# Patient Record
Sex: Female | Born: 2005 | Race: White | Hispanic: No | Marital: Single | Smoking: Never smoker
Health system: Southern US, Community
[De-identification: ages and names within clinical notes are randomized; demographics above are authoritative.]

## PROBLEM LIST (undated history)

## (undated) DIAGNOSIS — F32A Depression, unspecified: Secondary | ICD-10-CM

## (undated) DIAGNOSIS — F429 Obsessive-compulsive disorder, unspecified: Secondary | ICD-10-CM

## (undated) DIAGNOSIS — J45909 Unspecified asthma, uncomplicated: Secondary | ICD-10-CM

## (undated) DIAGNOSIS — K5900 Constipation, unspecified: Secondary | ICD-10-CM

## (undated) DIAGNOSIS — T7840XA Allergy, unspecified, initial encounter: Secondary | ICD-10-CM

## (undated) DIAGNOSIS — F509 Eating disorder, unspecified: Secondary | ICD-10-CM

## (undated) DIAGNOSIS — K219 Gastro-esophageal reflux disease without esophagitis: Secondary | ICD-10-CM

## (undated) HISTORY — DX: Obsessive-compulsive disorder, unspecified: F42.9

## (undated) HISTORY — DX: Constipation, unspecified: K59.00

## (undated) HISTORY — DX: Gastro-esophageal reflux disease without esophagitis: K21.9

## (undated) HISTORY — DX: Unspecified asthma, uncomplicated: J45.909

## (undated) HISTORY — DX: Depression, unspecified: F32.A

## (undated) HISTORY — PX: MYRINGOTOMY: SUR874

## (undated) HISTORY — DX: Allergy, unspecified, initial encounter: T78.40XA

---
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
## 2023-01-02 ENCOUNTER — Encounter: Payer: Self-pay | Admitting: Student

## 2023-01-02 ENCOUNTER — Ambulatory Visit: Payer: BC Managed Care – PPO | Admitting: Student

## 2023-01-02 ENCOUNTER — Ambulatory Visit: Payer: BC Managed Care – PPO | Admitting: Plastic Surgery

## 2023-01-02 VITALS — BP 120/76 | HR 105 | Ht 64.5 in | Wt 156.8 lb

## 2023-01-02 DIAGNOSIS — M549 Dorsalgia, unspecified: Secondary | ICD-10-CM

## 2023-01-02 DIAGNOSIS — N62 Hypertrophy of breast: Secondary | ICD-10-CM

## 2023-01-02 NOTE — Progress Notes (Signed)
   Referring Provider Karie Georges, MD [address] [place],  [place] [address]   CC: No chief complaint on file.     Ann Vaughan is an 17 y.o. female.  HPI: Patient is a 17 y.o. year old female here for follow up after completing physical therapy for pain related to macromastia.   She was seen for initial consult by Dr. Ladona Ridgel on 09/07/2022.  At that time, patient complained of upper back and neck pain which she attributed to the large size of her breasts.  Patient reported her breasts were significantly out of proportion to her body size and would like to be much smaller.  It was noted that patient had large breasts which were out of proportion to the size of her body.  Physical therapy was ordered for the patient.  Today, patient presents with her mother at bedside.  Patient states that she is doing well, and she completed 6 weeks of physical therapy.  She states that although she completed physical therapy, she is still having some upper back pain.  She also states that she feels her breasts are not proportionate to the size of her body and also sometimes feels that is difficult to keep good posture due to her enlarged breasts.  She states that she would like to move forward with a breast reduction.  She denies any recent changes in her health.   Review of Systems General: Denies any recent changes in her health MSK: Endorses ongoing back discomfort Skin: Denies rashes  Physical Exam    09/07/2022   10:15 AM 09/04/2022    1:28 PM 05/21/2022    3:16 PM  Vitals with BMI  Height 5\' 4"  5\' 4"    Weight 140 lbs 10 oz 139 lbs 5 oz 129 lbs 3 oz  BMI 24.12 23.9   Systolic 112 100 034  Diastolic 71 60 67  Pulse 128 105 112    General:  No acute distress,  Alert and oriented, Non-Toxic, Normal speech and affect Psych: Normal behavior and mood Respiratory: No increased WOB MSK: Ambulatory  Assessment/Plan  Macromastia   Patient is interested in pursuing surgical  intervention for bilateral breast reduction. Patient has completed at least 6 weeks of physical therapy for pain related to macromastia.  Discussed with patient we would submit to insurance for authorization, discussed approval could take up to 6 weeks.   All of the patient's and the patient's mother's questions were answered to their satisfaction.  Instructed them to call if they have any more questions.  Laurena Spies 01/02/2023, 10:07 AM

## 2023-01-14 ENCOUNTER — Encounter: Payer: Self-pay | Admitting: Family Medicine

## 2023-01-14 ENCOUNTER — Ambulatory Visit (INDEPENDENT_AMBULATORY_CARE_PROVIDER_SITE_OTHER): Payer: BC Managed Care – PPO | Admitting: Family Medicine

## 2023-01-14 VITALS — BP 90/60 | HR 95 | Temp 98.6°F | Ht 65.0 in | Wt 160.3 lb

## 2023-01-14 DIAGNOSIS — Z23 Encounter for immunization: Secondary | ICD-10-CM | POA: Diagnosis not present

## 2023-01-14 DIAGNOSIS — J4530 Mild persistent asthma, uncomplicated: Secondary | ICD-10-CM | POA: Diagnosis not present

## 2023-01-14 DIAGNOSIS — Z0000 Encounter for general adult medical examination without abnormal findings: Secondary | ICD-10-CM | POA: Diagnosis not present

## 2023-01-14 MED ORDER — QVAR REDIHALER 40 MCG/ACT IN AERB: 2.0000 | INHALATION_SPRAY | Freq: Two times a day (BID) | RESPIRATORY_TRACT | 3 refills | Status: AC

## 2023-01-14 NOTE — Patient Instructions (Addendum)

## 2023-01-14 NOTE — Progress Notes (Signed)
Complete physical exam  Patient: Ann Vaughan   DOB: 01-May-2005   17 y.o. Female  MRN: [id number]  Subjective:    Chief Complaint  Patient presents with   Annual Exam    Ann Vaughan is a 17 y.o. female who presents today for a complete physical exam. She reports consuming a  restrictive eating disorder  diet. Gym/ health club routine includes cardio and light weights. She generally feels well. She reports sleeping pt has chronic insomnia. She does not have additional problems to discuss today.    Most recent fall risk assessment:    03/05/2018    2:21 PM  Fall Risk   Falls in the past year? 0     Most recent depression screenings:    01/14/2023    8:32 AM 09/04/2022    1:26 PM  PHQ 2/9 Scores  PHQ - 2 Score 6 5  PHQ- 9 Score 19 17    Vision:Not within last year  and Dental: No current dental problems and Receives regular dental care  Patient Active Problem List   Diagnosis Date Noted   Avoidant-restrictive food intake disorder (ARFID) 09/04/2022   Chronic bilateral low back pain 09/04/2022   Gynecomastia, female 09/04/2022   Mild intermittent asthma    Pelvic pain in female 04/24/2022   Endometriosis 04/24/2022   MDD (major depressive disorder), recurrent, severe, 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/23/2019   Self-injurious behavior 03/23/2019   Suicide ideation 03/23/2019      Patient Care Team: Karie Georges, MD as PCP - General (Family Medicine)   Outpatient Medications Prior to Visit  Medication Sig   acetaminophen (TYLENOL) 500 MG tablet Take 1 tablet (500 mg total) by mouth every 6 (six) hours as needed.   albuterol (PROVENTIL) (2.5 MG/3ML) 0.083% nebulizer solution Take 2.5 mg by nebulization every 6 (six) hours as needed. For shortness of breath   albuterol (VENTOLIN HFA) 108 (90 Base) MCG/ACT inhaler Inhale 1 puff into the lungs every 6 (six) hours as needed for wheezing or shortness of breath.    cholecalciferol (VITAMIN D3) 25 MCG (1000 UT) tablet Take  2,000 Units by mouth in the morning.   escitalopram (LEXAPRO) 20 MG tablet Take 20 mg by mouth daily.   ibuprofen (ADVIL) 800 MG tablet TAKE 1 TABLET (800 MG TOTAL) BY MOUTH 3 (THREE) TIMES DAILY WITH MEALS AS NEEDED FOR HEADACHE, MODERATE PAIN OR CRAMPING.   lamoTRIgine (LAMICTAL) 150 MG tablet Take 150 mg by mouth 2 (two) times daily.   lithium carbonate 150 MG capsule Take 150 mg by mouth at bedtime.   methylphenidate 18 MG PO CR tablet Take 18 mg by mouth every morning.   montelukast (SINGULAIR) 10 MG tablet Take 1 tablet (10 mg total) by mouth at bedtime.   norethindrone-ethinyl estradiol-iron (JUNEL FE 1.5/30) 1.5-30 MG-MCG tablet Take 1 tablet by mouth daily.   polyethylene glycol (MIRALAX / GLYCOLAX) 17 g packet Take 17 g by mouth daily as needed.   [DISCONTINUED] hydrOXYzine (ATARAX/VISTARIL) 10 MG tablet TAKE ONE TABLET BY MOUTH THREE TIMES A DAY AS NEEDED (Patient taking differently: Take 10 mg by mouth at bedtime.)   [DISCONTINUED] ondansetron (ZOFRAN-ODT) 4 MG disintegrating tablet Take 4 mg by mouth every 8 (eight) hours as needed for nausea or vomiting.   No facility-administered medications prior to visit.    Review of Systems  HENT:  Negative for hearing loss.   Eyes:  Negative for blurred vision.  Respiratory:  Negative for shortness of breath.  Cardiovascular:  Negative for chest pain.  Gastrointestinal: Negative.   Genitourinary: Negative.   Musculoskeletal:  Negative for back pain.  Neurological:  Negative for headaches.  Psychiatric/Behavioral:  Negative for depression.        Objective:     BP 90/60 (BP Location: Left Arm, Patient Position: Sitting, Cuff Size: Normal)   Pulse 95   Temp 98.6 F (37 C) (Oral)   Ht 5\' 5"  (1.651 m)   Wt 160 lb 4.8 oz (72.7 kg)   SpO2 98%   BMI 26.68 kg/m    Physical Exam Vitals reviewed.  Constitutional:      Appearance: Normal appearance. She is well-groomed and normal weight.  HENT:     Right Ear: Tympanic membrane  and ear canal normal.     Left Ear: Tympanic membrane and ear canal normal.     Mouth/Throat:     Mouth: Mucous membranes are moist.     Pharynx: No posterior oropharyngeal erythema.  Eyes:     Conjunctiva/sclera: Conjunctivae normal.  Neck:     Thyroid: No thyromegaly.  Cardiovascular:     Rate and Rhythm: Normal rate and regular rhythm.     Pulses: Normal pulses.     Heart sounds: S1 normal and S2 normal.  Pulmonary:     Effort: Pulmonary effort is normal.     Breath sounds: Normal breath sounds and air entry.  Abdominal:     General: Abdomen is flat. Bowel sounds are normal.     Palpations: Abdomen is soft.  Musculoskeletal:     Right lower leg: No edema.     Left lower leg: No edema.  Lymphadenopathy:     Cervical: No cervical adenopathy.  Neurological:     Mental Status: She is alert and oriented to person, place, and time. Mental status is at baseline.     Gait: Gait is intact.  Psychiatric:        Mood and Affect: Mood and affect normal.        Speech: Speech normal.        Behavior: Behavior normal.        Judgment: Judgment normal.      No results found for any visits on 01/14/23.     Assessment & Plan:    Routine Health Maintenance and Physical Exam  Immunization History  Administered Date(s) Administered   Influenza,inj,Quad PF,6+ Mos 12/09/2018   PFIZER(Purple Top)SARS-COV-2 Vaccination 07/01/2019, 07/17/2019, 02/17/2020, 12/09/2020   Tdap 09/03/2021    Health Maintenance  Topic Date Due   HPV VACCINES (1 - 3-dose series) Never done   COVID-19 Vaccine (5 - 2023-24 season) 10/14/2022   INFLUENZA VACCINE  05/13/2023 (Originally 09/13/2022)   Hepatitis C Screening  09/04/2023 (Originally 07/22/2021)   HIV Screening  09/04/2023 (Originally 07/23/2018)   DTaP/Tdap/Td (2 - Td or Tdap) 09/04/2031    Discussed health benefits of physical activity, and encouraged her to engage in regular exercise appropriate for her age and condition.  Lipid  screening  Mild persistent asthma without complication -     Qvar RediHaler; Inhale 2 puffs into the lungs 2 (two) times daily.  Dispense: 1 each; Refill: 3  Routine general medical examination at a health care facility  Normal physical exam findings. Counseled patient on the flu vaccination, healthy sleep habits and adding probiotics for her gut issues. Pt is interested in coming off the singulair, will add Qvar inhaler to control her asthma so she can try coming off the singulair.   Return  in 1 year (on 01/14/2024).     Karie Georges, MD

## 2023-02-02 ENCOUNTER — Other Ambulatory Visit: Payer: Self-pay | Admitting: Family Medicine

## 2023-02-04 MED ORDER — ALBUTEROL SULFATE HFA 108 (90 BASE) MCG/ACT IN AERS: 1.0000 | INHALATION_SPRAY | Freq: Four times a day (QID) | RESPIRATORY_TRACT | 1 refills | Status: DC | PRN

## 2023-02-15 ENCOUNTER — Emergency Department (HOSPITAL_COMMUNITY)
Admission: EM | Admit: 2023-02-15 | Discharge: 2023-02-15 | Disposition: A | Discharge status: Home / Self Care | Payer: BC Managed Care – PPO | Attending: Emergency Medicine | Admitting: Emergency Medicine

## 2023-02-15 ENCOUNTER — Other Ambulatory Visit: Payer: Self-pay | Admitting: Family Medicine

## 2023-02-15 DIAGNOSIS — Z20822 Contact with and (suspected) exposure to covid-19: Secondary | ICD-10-CM | POA: Diagnosis not present

## 2023-02-15 DIAGNOSIS — J4541 Moderate persistent asthma with (acute) exacerbation: Secondary | ICD-10-CM | POA: Diagnosis not present

## 2023-02-15 DIAGNOSIS — J4531 Mild persistent asthma with (acute) exacerbation: Secondary | ICD-10-CM | POA: Insufficient documentation

## 2023-02-15 DIAGNOSIS — R059 Cough, unspecified: Secondary | ICD-10-CM | POA: Diagnosis not present

## 2023-02-15 LAB — RESP PANEL BY RT-PCR (RSV, FLU A&B, COVID)  RVPGX2
Influenza A by PCR: NEGATIVE
Influenza B by PCR: NEGATIVE
Resp Syncytial Virus by PCR: NEGATIVE
SARS Coronavirus 2 by RT PCR: NEGATIVE

## 2023-02-15 MED ORDER — ALBUTEROL SULFATE (2.5 MG/3ML) 0.083% IN NEBU
5.0000 mg | INHALATION_SOLUTION | Freq: Once | RESPIRATORY_TRACT | Status: AC
  Administered 2023-02-15: 5 mg via RESPIRATORY_TRACT
  Filled 2023-02-15: qty 6

## 2023-02-15 MED ORDER — IPRATROPIUM BROMIDE 0.02 % IN SOLN
0.5000 mg | Freq: Once | RESPIRATORY_TRACT | Status: AC
  Administered 2023-02-15: 0.5 mg via RESPIRATORY_TRACT
  Filled 2023-02-15: qty 2.5

## 2023-02-15 MED ORDER — DEXAMETHASONE 4 MG PO TABS
10.0000 mg | ORAL_TABLET | Freq: Once | ORAL | Status: AC
  Administered 2023-02-15: 10 mg via ORAL
  Filled 2023-02-15: qty 1

## 2023-02-15 NOTE — ED Triage Notes (Signed)
 Patient states over the last 4-5 hours she's been having an asthma attack with no relief from her rescue inhaler.

## 2023-02-15 NOTE — ED Triage Notes (Signed)
 Called to assess patient for complaints of asthma attack. Patient speaking in full sentences. No dyspnea noted. VSS on room air. BBS clear in all fields. Strong, dry cough. Patient states she has recently transitioned off singulair  and using zyrtec with qvar  for maintenance. Received flu shot approximatly 3 weeks ago and has not gotten a covid booster.

## 2023-02-15 NOTE — ED Provider Notes (Signed)
 [place] EMERGENCY DEPARTMENT AT [facility] Provider Note   CSN: [id number] Arrival date & time: 02/15/23  0441     History  Chief Complaint  Patient presents with   Asthma    Ann Vaughan is a 18 y.o. female.  Patient is a 18 year old female with a history of asthma, endometriosis, OCD and allergies who is presenting today with complaint of tightness in her chest, coughing and shortness of breath.  She reports it has been present since last night and has been continuous.  She has recently changed her medications and is on Qvar  daily but has stopped her Singulair  due to possible psychiatric side effects.  She has not had fever, nasal congestion or URI type symptoms.  Has tried her albuterol  at home without improvement.  She has no hemoptysis.  No prior history of PEs or DVTs but she is on an OCP.  She does not use tobacco products.  She does not have regular menses due to her OCP.  The history is provided by the patient.  Asthma       Home Medications Prior to Admission medications   Medication Sig Start Date End Date Taking? Authorizing Provider  acetaminophen  (TYLENOL ) 500 MG tablet Take 1 tablet (500 mg total) by mouth every 6 (six) hours as needed. 04/24/22   Ajewole, Christana, MD  albuterol  (PROVENTIL ) (2.5 MG/3ML) 0.083% nebulizer solution Take 2.5 mg by nebulization every 6 (six) hours as needed. For shortness of breath    [provider]  albuterol  (VENTOLIN  HFA) 108 (90 Base) MCG/ACT inhaler Inhale 1 puff into the lungs every 6 (six) hours as needed for wheezing or shortness of breath. 02/04/23   Ozell Heron HERO, MD  beclomethasone (QVAR  REDIHALER) 40 MCG/ACT inhaler Inhale 2 puffs into the lungs 2 (two) times daily. 01/14/23   Ozell Heron HERO, MD  cholecalciferol (VITAMIN D3) 25 MCG (1000 UT) tablet Take 2,000 Units by mouth in the morning.    [provider]  escitalopram  (LEXAPRO ) 20 MG tablet Take 20 mg by mouth daily.    [provider]  ibuprofen  (ADVIL ) 800 MG tablet TAKE 1 TABLET (800 MG TOTAL) BY MOUTH 3 (THREE) TIMES DAILY WITH MEALS AS NEEDED FOR HEADACHE, MODERATE PAIN OR CRAMPING. 05/24/22   Ajewole, Christana, MD  lamoTRIgine (LAMICTAL) 150 MG tablet Take 150 mg by mouth 2 (two) times daily. 07/04/22   [provider]  lithium  carbonate 150 MG capsule Take 150 mg by mouth at bedtime. 12/26/22   [provider]  methylphenidate 18 MG PO CR tablet Take 18 mg by mouth every morning. 08/29/22   [provider]  montelukast  (SINGULAIR ) 10 MG tablet Take 1 tablet (10 mg total) by mouth at bedtime. 11/05/22   Ozell Heron HERO, MD  norethindrone -ethinyl estradiol-iron (JUNEL FE 1.5/30) 1.5-30 MG-MCG tablet Take 1 tablet by mouth daily. 07/26/22   Ajewole, Christana, MD  polyethylene glycol (MIRALAX  / GLYCOLAX ) 17 g packet Take 17 g by mouth daily as needed. 04/24/22   Ajewole, Christana, MD      Allergies    Other    Review of Systems   Review of Systems  Physical Exam Updated Vital Signs BP 108/82   Pulse (!) 111   Temp 98.5 F (36.9 C) (Oral)   Resp 16   Ht 5' 4 (1.626 m)   Wt 72.6 kg   SpO2 97%   BMI 27.46 kg/m  Physical Exam Vitals and nursing note reviewed.  Constitutional:  General: She is not in acute distress.    Appearance: She is well-developed.  HENT:     Head: Normocephalic and atraumatic.  Eyes:     Pupils: Pupils are equal, round, and reactive to light.  Cardiovascular:     Rate and Rhythm: Regular rhythm. Tachycardia present.     Pulses: Normal pulses.     Heart sounds: Normal heart sounds. No murmur heard.    No friction rub.  Pulmonary:     Effort: Pulmonary effort is normal.     Breath sounds: Normal breath sounds. No wheezing or rales.     Comments: Persistent coughing on exam.  Mild decreased breath sounds in the bases but no audible wheezing Musculoskeletal:        General: No tenderness. Normal range of motion.     Comments: No edema   Skin:    General: Skin is warm and dry.     Findings: No rash.  Neurological:     Mental Status: She is alert and oriented to person, place, and time.     Cranial Nerves: No cranial nerve deficit.  Psychiatric:        Behavior: Behavior normal.     ED Results / Procedures / Treatments   Labs (all labs ordered are listed, but only abnormal results are displayed) Labs Reviewed  RESP PANEL BY RT-PCR (RSV, FLU A&B, COVID)  [id number]    EKG None  Radiology No results found.  Procedures Procedures    Medications Ordered in ED Medications  albuterol  (PROVENTIL ) (2.5 MG/3ML) 0.083% nebulizer solution 5 mg (5 mg Nebulization Given 02/15/23 0755)  ipratropium (ATROVENT ) nebulizer solution 0.5 mg (0.5 mg Nebulization Given 02/15/23 0755)  dexamethasone  (DECADRON ) tablet 10 mg (10 mg Oral Given 02/15/23 0841)    ED Course/ Medical Decision Making/ A&P                                 Medical Decision Making Risk Prescription drug management.   Pt with typical asthma exacerbation  symptoms.  No infectious sx, productive cough or other complaints.  Low suspicion for PE.  Decreased breath sounds and coughing on exam with sats of 97% on RA.  Will give albuterol /atrovent  and recheck.  Pt improved after neb and feels much better.  Will give 1 time dose of decadron  and pt has all other meds she needs. .         Final Clinical Impression(s) / ED Diagnoses Final diagnoses:  Mild persistent asthma with exacerbation    Rx / DC Orders ED Discharge Orders     None         Doretha Folks, MD 02/15/23 1055

## 2023-02-15 NOTE — Discharge Instructions (Addendum)
 Continue to use your qvar and zyrtec.  Return if your symptoms worsen

## 2023-02-19 ENCOUNTER — Other Ambulatory Visit (HOSPITAL_BASED_OUTPATIENT_CLINIC_OR_DEPARTMENT_OTHER): Payer: Self-pay

## 2023-02-19 MED ORDER — ALBUTEROL SULFATE (2.5 MG/3ML) 0.083% IN NEBU
2.5000 mg | INHALATION_SOLUTION | Freq: Four times a day (QID) | RESPIRATORY_TRACT | 2 refills | Status: AC | PRN
  Filled 2023-02-19: qty 150, 13d supply, fill #0

## 2023-03-04 ENCOUNTER — Other Ambulatory Visit (HOSPITAL_BASED_OUTPATIENT_CLINIC_OR_DEPARTMENT_OTHER): Payer: Self-pay

## 2023-03-26 ENCOUNTER — Telehealth: Payer: Self-pay

## 2023-03-26 NOTE — Telephone Encounter (Signed)
Patient called stating she has not heard anything regarding her case or referral.  Spoke with patient and informed patient it is pending, just waiting to hear back from the insurance company.

## 2023-03-29 DIAGNOSIS — F900 Attention-deficit hyperactivity disorder, predominantly inattentive type: Secondary | ICD-10-CM | POA: Diagnosis not present

## 2023-03-29 DIAGNOSIS — F331 Major depressive disorder, recurrent, moderate: Secondary | ICD-10-CM | POA: Diagnosis not present

## 2023-03-29 DIAGNOSIS — F419 Anxiety disorder, unspecified: Secondary | ICD-10-CM | POA: Diagnosis not present

## 2023-04-12 DIAGNOSIS — F331 Major depressive disorder, recurrent, moderate: Secondary | ICD-10-CM | POA: Diagnosis not present

## 2023-04-12 DIAGNOSIS — Z79899 Other long term (current) drug therapy: Secondary | ICD-10-CM | POA: Diagnosis not present

## 2023-04-12 DIAGNOSIS — F419 Anxiety disorder, unspecified: Secondary | ICD-10-CM | POA: Diagnosis not present

## 2023-05-02 ENCOUNTER — Other Ambulatory Visit: Payer: Self-pay | Admitting: Family Medicine

## 2023-05-02 ENCOUNTER — Ambulatory Visit: Admitting: Family Medicine

## 2023-05-02 VITALS — BP 120/90 | HR 110 | Temp 98.3°F | Ht 64.01 in | Wt 158.0 lb

## 2023-05-02 DIAGNOSIS — R0981 Nasal congestion: Secondary | ICD-10-CM | POA: Diagnosis not present

## 2023-05-02 DIAGNOSIS — R062 Wheezing: Secondary | ICD-10-CM

## 2023-05-02 DIAGNOSIS — J4541 Moderate persistent asthma with (acute) exacerbation: Secondary | ICD-10-CM

## 2023-05-02 LAB — POCT INFLUENZA A/B
Influenza A, POC: NEGATIVE
Influenza B, POC: NEGATIVE

## 2023-05-02 LAB — POC COVID19 BINAXNOW: SARS Coronavirus 2 Ag: NEGATIVE

## 2023-05-02 MED ORDER — VENTOLIN HFA 108 (90 BASE) MCG/ACT IN AERS: 2.0000 | INHALATION_SPRAY | RESPIRATORY_TRACT | 1 refills | Status: AC | PRN

## 2023-05-02 MED ORDER — PREDNISONE 20 MG PO TABS: 40.0000 mg | ORAL_TABLET | Freq: Every day | ORAL | 0 refills | Status: AC

## 2023-05-02 NOTE — Telephone Encounter (Signed)
 I don't prescribe this for the patient, she will need to contact Dr. Kizzie Bane her [facility] provider for refills on this medication

## 2023-05-02 NOTE — Patient Instructions (Signed)
 Viral testing is negative.   I have sent in prednisone for you to take 2 tablets once daily in the morning with breakfast for the next 5 days.  I have sent in an albuterol inhaler for you to use 2 puffs every 4 hours as needed for wheezing.  Follow-up with me for new or worsening symptoms.  School note attached to [employer]

## 2023-05-02 NOTE — Progress Notes (Signed)
 Acute Office Visit  Subjective:     Patient ID: Ann Vaughan, female    DOB: 26-Sep-2005, 18 y.o.   MRN: [id number]  Chief Complaint  Patient presents with   Acute Visit    Headaches, nausea, dry cough, wheezing, SOB, itchy ears. Ongoing for about 3 days    HPI Patient is in today for evaluation of cough, SOB, wheezing, HA, nausea, itchy ears, for the last 3 days. Has hx asthma.  Requesting refill of albuterol inhaler, has been out.  Has been using Qvar, zyrtec, albuterol neb solution with mild relief.  Denies known sick contacts. Denies abdominal pain, vomiting, diarrhea, rash, fever, chills, other symptoms.  Medical hx as outlined below.  ROS Per HPI      Objective:    BP (!) 120/90 (BP Location: Left Arm, Patient Position: Sitting)   Pulse (!) 110   Temp 98.3 F (36.8 C) (Temporal)   Ht 5' 4.01" (1.626 m)   Wt 158 lb (71.7 kg)   SpO2 96%   BMI 27.11 kg/m    Physical Exam Vitals and nursing note reviewed.  Constitutional:      General: She is not in acute distress. HENT:     Head: Normocephalic and atraumatic.     Right Ear: Tympanic membrane and ear canal normal.     Left Ear: Tympanic membrane and ear canal normal.     Nose: Congestion present.     Mouth/Throat:     Mouth: Mucous membranes are moist.     Pharynx: Oropharynx is clear. No oropharyngeal exudate or posterior oropharyngeal erythema.     Comments: Oropharyngeal cobblestoning   Eyes:     Extraocular Movements: Extraocular movements intact.  Cardiovascular:     Rate and Rhythm: Regular rhythm. Tachycardia present.     Heart sounds: Normal heart sounds.  Pulmonary:     Effort: Pulmonary effort is normal. No respiratory distress.     Breath sounds: Wheezing present. No rhonchi or rales.  Musculoskeletal:     Cervical back: Normal range of motion and neck supple.  Lymphadenopathy:     Cervical: No cervical adenopathy.  Skin:    General: Skin is warm and dry.  Neurological:     Mental  Status: She is alert.    No results found for any visits on 05/02/23.      Assessment & Plan:   Wheezing -     POCT Influenza A/B -     POC COVID-19 BinaxNow  Nasal congestion -     POCT Influenza A/B -     POC COVID-19 BinaxNow  Moderate persistent asthma with acute exacerbation -     Ventolin HFA; Inhale 2 puffs into the lungs every 4 (four) hours as needed for wheezing or shortness of breath.  Dispense: 18 each; Refill: 1 -     predniSONE; Take 2 tablets (40 mg total) by mouth daily for 5 days.  Dispense: 10 tablet; Refill: 0  School note attached to MyChart  Meds ordered this encounter  Medications   albuterol (VENTOLIN HFA) 108 (90 Base) MCG/ACT inhaler    Sig: Inhale 2 puffs into the lungs every 4 (four) hours as needed for wheezing or shortness of breath.    Dispense:  18 each    Refill:  1   predniSONE (DELTASONE) 20 MG tablet    Sig: Take 2 tablets (40 mg total) by mouth daily for 5 days.    Dispense:  10 tablet    Refill:  0    Return if symptoms worsen or fail to improve.  Moshe Cipro, FNP

## 2023-05-15 NOTE — Progress Notes (Signed)
 Patient ID: Ann Vaughan, female    DOB: 04-Sep-2005, 18 y.o.   MRN: [id number]  No chief complaint on file.   No diagnosis found.   History of Present Illness: Ann Vaughan is a 18 y.o.  female  with a history of macromastia.  She presents for preoperative evaluation for upcoming procedure, Bilateral Breast Reduction, scheduled for 05/28/2023 with Dr.  Ladona Ridgel  The patient {HAS HAS [id number]} had problems with anesthesia. ***  Summary of Previous Visit: ***  Estimated excess breast tissue to be removed at time of surgery: *** grams  Job: ***  PMH Significant for: ***  Stop methylphenidate 2 days prior***   Past Medical History: Allergies: Allergies  Allergen Reactions   Other Shortness Of Breath    Environental/Asthma     Current Medications:  Current Outpatient Medications:    acetaminophen (TYLENOL) 500 MG tablet, Take 1 tablet (500 mg total) by mouth every 6 (six) hours as needed. (Patient not taking: Reported on 05/02/2023), Disp: 90 tablet, Rfl: 0   albuterol (PROVENTIL) (2.5 MG/3ML) 0.083% nebulizer solution, Inhale 3 mLs (1 vial) by nebulization every 6 (six) hours as needed. For shortness of breath, Disp: 150 mL, Rfl: 2   albuterol (VENTOLIN HFA) 108 (90 Base) MCG/ACT inhaler, Inhale 2 puffs into the lungs every 4 (four) hours as needed for wheezing or shortness of breath., Disp: 18 each, Rfl: 1   beclomethasone (QVAR REDIHALER) 40 MCG/ACT inhaler, Inhale 2 puffs into the lungs 2 (two) times daily., Disp: 1 each, Rfl: 3   cholecalciferol (VITAMIN D3) 25 MCG (1000 UT) tablet, Take 2,000 Units by mouth in the morning., Disp: , Rfl:    escitalopram (LEXAPRO) 20 MG tablet, Take 20 mg by mouth daily., Disp: , Rfl:    hydrOXYzine (ATARAX) 25 MG tablet, Take 25-50 mg by mouth at bedtime., Disp: , Rfl:    ibuprofen (ADVIL) 800 MG tablet, TAKE 1 TABLET (800 MG TOTAL) BY MOUTH 3 (THREE) TIMES DAILY WITH MEALS AS NEEDED FOR HEADACHE, MODERATE PAIN OR CRAMPING., Disp: 90  tablet, Rfl: 0   lamoTRIgine (LAMICTAL) 150 MG tablet, Take 150 mg by mouth 2 (two) times daily., Disp: , Rfl:    lithium carbonate 150 MG capsule, Take 150 mg by mouth at bedtime., Disp: , Rfl:    methylphenidate 18 MG PO CR tablet, Take 18 mg by mouth every morning., Disp: , Rfl:    montelukast (SINGULAIR) 10 MG tablet, Take 1 tablet (10 mg total) by mouth at bedtime. (Patient not taking: Reported on 05/02/2023), Disp: 30 tablet, Rfl: 3   norethindrone-ethinyl estradiol-iron (JUNEL FE 1.5/30) 1.5-30 MG-MCG tablet, Take 1 tablet by mouth daily., Disp: 90 tablet, Rfl: 4   polyethylene glycol (MIRALAX / GLYCOLAX) 17 g packet, Take 17 g by mouth daily as needed. (Patient not taking: Reported on 05/02/2023), Disp: 14 each, Rfl: 0  Past Medical Problems: Past Medical History:  Diagnosis Date   Allergies    Anxiety 2020   Asthma    Asthma    Constipation    12/20/2021   Depression    Eating disorder    Endometriosis 2019   GERD (gastroesophageal reflux disease)    OCD (obsessive compulsive disorder)     Past Surgical History: Past Surgical History:  Procedure Laterality Date   CYSTOSCOPY N/A 04/24/2022   Procedure: CYSTOSCOPY;  Surgeon: Lorriane Shire, MD;  Location: [facility] OR;  Service: Gynecology;  Laterality: N/A;   LAPAROSCOPY N/A 04/24/2022   Procedure: LAPAROSCOPY DIAGNOSTIC  AND POSSIBLE EXCISION OF ENDOMETRIOSIS;  Surgeon: Lorriane Shire, MD;  Location: [facility] OR;  Service: Gynecology;  Laterality: N/A;  Pull instruments GYN Cysto Set  Cysto Tubing and addtional [employer] Camera   MYRINGOTOMY      Social History: Social History   Socioeconomic History   Marital status: Single    Spouse name: Not on file   Number of children: 0   Years of education: Not on file   Highest education level: Some college, no degree  Occupational History   Occupation: retail associated  Tobacco Use   Smoking status: Never    Passive exposure: Yes   Smokeless tobacco: Never  Vaping Use    Vaping status: Never Used  Substance and Sexual Activity   Alcohol use: Not Currently   Drug use: Never   Sexual activity: Never    Birth control/protection: Abstinence, Pill  Other Topics Concern   Not on file  Social History Narrative   Not on file   Social Drivers of Health   Financial Resource Strain: Low Risk  (05/02/2023)   Overall Financial Resource Strain (CARDIA)    Difficulty of Paying Living Expenses: Not hard at all  Food Insecurity: No Food Insecurity (05/02/2023)   Hunger Vital Sign    Worried About Running Out of Food in the Last Year: Never true    Ran Out of Food in the Last Year: Never true  Transportation Needs: No Transportation Needs (05/02/2023)   PRAPARE - Administrator, Civil Service (Medical): No    Lack of Transportation (Non-Medical): No  Physical Activity: Insufficiently Active (05/02/2023)   Exercise Vital Sign    Days of Exercise per Week: 2 days    Minutes of Exercise per Session: 10 min  Stress: Stress Concern Present (05/02/2023)   [employer] of Occupational Health - Occupational Stress Questionnaire    Feeling of Stress : Very much  Social Connections: Socially Isolated (05/02/2023)   Social Connection and Isolation Panel [NHANES]    Frequency of Communication with Friends and Family: More than three times a week    Frequency of Social Gatherings with Friends and Family: Twice a week    Attends Religious Services: Never    Database administrator or Organizations: No    Attends Engineer, structural: Not on file    Marital Status: Never married  Intimate Partner Violence: Unknown (05/19/2021)   Received from [employer], [facility]   HITS    Physically Hurt: Not on file    Insult or Talk Down To: Not on file    Threaten Physical Harm: Not on file    Scream or Curse: Not on file    Family History: Family History  Problem Relation Age of Onset   Asthma Mother    Depression Mother    Irritable bowel syndrome  Mother    Scleroderma Mother    Anxiety disorder Mother    Alcohol abuse Father    Asthma Brother    Diabetes Maternal Grandmother    Arthritis Maternal Grandmother    Depression Maternal Grandmother    Arthritis Maternal Grandfather    Depression Maternal Grandfather    Cancer Paternal Grandmother    Uterine cancer Paternal Grandmother    Arthritis Paternal Grandfather    Esophageal cancer Neg Hx    Liver disease Neg Hx     Review of Systems: ROS  Physical Exam: Vital Signs There were no vitals taken for this visit.  Physical Exam ***  Constitutional:      General: Not in acute distress.    Appearance: Normal appearance. Not ill-appearing.  HENT:     Head: Normocephalic and atraumatic.  Eyes:     Pupils: Pupils are equal, round Neck:     Musculoskeletal: Normal range of motion.  Cardiovascular:     Rate and Rhythm: Normal rate    Pulses: Normal pulses.  Pulmonary:     Effort: Pulmonary effort is normal. No respiratory distress.  Musculoskeletal: Normal range of motion.  Skin:    General: Skin is warm and dry.     Findings: No erythema or rash.  Neurological:     General: No focal deficit present.     Mental Status: Alert and oriented to person, place, and time. Mental status is at baseline.     Motor: No weakness.  Psychiatric:        Mood and Affect: Mood normal.        Behavior: Behavior normal.    Assessment/Plan: The patient is scheduled for bilateral breast reduction with Dr. {[id number]::"VZDGLO","[id number]"}.  Risks, benefits, and alternatives of procedure discussed, questions answered and consent obtained.    Smoking Status: ***; Counseling Given? *** Last Mammogram: ***; Results: ***  Caprini Score: ***; Risk Factors include: ***, BMI *** 25, and length of planned surgery. Recommendation for mechanical *** pharmacological prophylaxis. Encourage early ambulation.   Pictures obtained: @consult ***  Post-op Rx sent to pharmacy:  {Blank:19197::"Oxycodone, Zofran, Keflex","Oxycodone, Zofran"}  Patient was provided with the breast reduction and General Surgical Risk consent document and Pain Medication Agreement prior to their appointment.  They had adequate time to read through the risk consent documents and Pain Medication Agreement. We also discussed them in person together during this preop appointment. All of their questions were answered to their satisfaction.  Recommended calling if they have any further questions.  Risk consent form and Pain Medication Agreement to be scanned into patient's chart.  The risk that can be encountered with breast reduction were discussed and include the following but not limited to these:  Breast asymmetry, fluid accumulation, firmness of the breast, inability to breast feed, loss of nipple or areola, skin loss, decrease or no nipple sensation, fat necrosis of the breast tissue, bleeding, infection, healing delay.  There are risks of anesthesia, changes to skin sensation and injury to nerves or blood vessels.  The muscle can be temporarily or permanently injured.  You may have an allergic reaction to tape, suture, glue, blood products which can result in skin discoloration, swelling, pain, skin lesions, poor healing.  Any of these can lead to the need for revisonal surgery or stage procedures.  A reduction has potential to interfere with diagnostic procedures.  Nipple or breast piercing can increase risks of infection.  This procedure is best done when the breast is fully developed.  Changes in the breast will continue to occur over time.  Pregnancy can alter the outcomes of previous breast reduction surgery, weight gain and weigh loss can also effect the long term appearance.     Electronically signed by: Kermit Balo Barak Bialecki, PA-C 05/15/2023 2:36 PM

## 2023-05-15 NOTE — H&P (View-Only) (Signed)
 Patient ID: Ann Vaughan, female    DOB: 04-Sep-2005, 18 y.o.   MRN: [id number]  No chief complaint on file.   No diagnosis found.   History of Present Illness: Ann Vaughan is a 18 y.o.  female  with a history of macromastia.  She presents for preoperative evaluation for upcoming procedure, Bilateral Breast Reduction, scheduled for 05/28/2023 with Dr.  Ladona Ridgel  The patient {HAS HAS [id number]} had problems with anesthesia. ***  Summary of Previous Visit: ***  Estimated excess breast tissue to be removed at time of surgery: *** grams  Job: ***  PMH Significant for: ***  Stop methylphenidate 2 days prior***   Past Medical History: Allergies: Allergies  Allergen Reactions   Other Shortness Of Breath    Environental/Asthma     Current Medications:  Current Outpatient Medications:    acetaminophen (TYLENOL) 500 MG tablet, Take 1 tablet (500 mg total) by mouth every 6 (six) hours as needed. (Patient not taking: Reported on 05/02/2023), Disp: 90 tablet, Rfl: 0   albuterol (PROVENTIL) (2.5 MG/3ML) 0.083% nebulizer solution, Inhale 3 mLs (1 vial) by nebulization every 6 (six) hours as needed. For shortness of breath, Disp: 150 mL, Rfl: 2   albuterol (VENTOLIN HFA) 108 (90 Base) MCG/ACT inhaler, Inhale 2 puffs into the lungs every 4 (four) hours as needed for wheezing or shortness of breath., Disp: 18 each, Rfl: 1   beclomethasone (QVAR REDIHALER) 40 MCG/ACT inhaler, Inhale 2 puffs into the lungs 2 (two) times daily., Disp: 1 each, Rfl: 3   cholecalciferol (VITAMIN D3) 25 MCG (1000 UT) tablet, Take 2,000 Units by mouth in the morning., Disp: , Rfl:    escitalopram (LEXAPRO) 20 MG tablet, Take 20 mg by mouth daily., Disp: , Rfl:    hydrOXYzine (ATARAX) 25 MG tablet, Take 25-50 mg by mouth at bedtime., Disp: , Rfl:    ibuprofen (ADVIL) 800 MG tablet, TAKE 1 TABLET (800 MG TOTAL) BY MOUTH 3 (THREE) TIMES DAILY WITH MEALS AS NEEDED FOR HEADACHE, MODERATE PAIN OR CRAMPING., Disp: 90  tablet, Rfl: 0   lamoTRIgine (LAMICTAL) 150 MG tablet, Take 150 mg by mouth 2 (two) times daily., Disp: , Rfl:    lithium carbonate 150 MG capsule, Take 150 mg by mouth at bedtime., Disp: , Rfl:    methylphenidate 18 MG PO CR tablet, Take 18 mg by mouth every morning., Disp: , Rfl:    montelukast (SINGULAIR) 10 MG tablet, Take 1 tablet (10 mg total) by mouth at bedtime. (Patient not taking: Reported on 05/02/2023), Disp: 30 tablet, Rfl: 3   norethindrone-ethinyl estradiol-iron (JUNEL FE 1.5/30) 1.5-30 MG-MCG tablet, Take 1 tablet by mouth daily., Disp: 90 tablet, Rfl: 4   polyethylene glycol (MIRALAX / GLYCOLAX) 17 g packet, Take 17 g by mouth daily as needed. (Patient not taking: Reported on 05/02/2023), Disp: 14 each, Rfl: 0  Past Medical Problems: Past Medical History:  Diagnosis Date   Allergies    Anxiety 2020   Asthma    Asthma    Constipation    12/20/2021   Depression    Eating disorder    Endometriosis 2019   GERD (gastroesophageal reflux disease)    OCD (obsessive compulsive disorder)     Past Surgical History: Past Surgical History:  Procedure Laterality Date   CYSTOSCOPY N/A 04/24/2022   Procedure: CYSTOSCOPY;  Surgeon: Lorriane Shire, MD;  Location: [facility] OR;  Service: Gynecology;  Laterality: N/A;   LAPAROSCOPY N/A 04/24/2022   Procedure: LAPAROSCOPY DIAGNOSTIC  AND POSSIBLE EXCISION OF ENDOMETRIOSIS;  Surgeon: Lorriane Shire, MD;  Location: [facility] OR;  Service: Gynecology;  Laterality: N/A;  Pull instruments GYN Cysto Set  Cysto Tubing and addtional [employer] Camera   MYRINGOTOMY      Social History: Social History   Socioeconomic History   Marital status: Single    Spouse name: Not on file   Number of children: 0   Years of education: Not on file   Highest education level: Some college, no degree  Occupational History   Occupation: retail associated  Tobacco Use   Smoking status: Never    Passive exposure: Yes   Smokeless tobacco: Never  Vaping Use    Vaping status: Never Used  Substance and Sexual Activity   Alcohol use: Not Currently   Drug use: Never   Sexual activity: Never    Birth control/protection: Abstinence, Pill  Other Topics Concern   Not on file  Social History Narrative   Not on file   Social Drivers of Health   Financial Resource Strain: Low Risk  (05/02/2023)   Overall Financial Resource Strain (CARDIA)    Difficulty of Paying Living Expenses: Not hard at all  Food Insecurity: No Food Insecurity (05/02/2023)   Hunger Vital Sign    Worried About Running Out of Food in the Last Year: Never true    Ran Out of Food in the Last Year: Never true  Transportation Needs: No Transportation Needs (05/02/2023)   PRAPARE - Administrator, Civil Service (Medical): No    Lack of Transportation (Non-Medical): No  Physical Activity: Insufficiently Active (05/02/2023)   Exercise Vital Sign    Days of Exercise per Week: 2 days    Minutes of Exercise per Session: 10 min  Stress: Stress Concern Present (05/02/2023)   [employer] of Occupational Health - Occupational Stress Questionnaire    Feeling of Stress : Very much  Social Connections: Socially Isolated (05/02/2023)   Social Connection and Isolation Panel [NHANES]    Frequency of Communication with Friends and Family: More than three times a week    Frequency of Social Gatherings with Friends and Family: Twice a week    Attends Religious Services: Never    Database administrator or Organizations: No    Attends Engineer, structural: Not on file    Marital Status: Never married  Intimate Partner Violence: Unknown (05/19/2021)   Received from [employer], [facility]   HITS    Physically Hurt: Not on file    Insult or Talk Down To: Not on file    Threaten Physical Harm: Not on file    Scream or Curse: Not on file    Family History: Family History  Problem Relation Age of Onset   Asthma Mother    Depression Mother    Irritable bowel syndrome  Mother    Scleroderma Mother    Anxiety disorder Mother    Alcohol abuse Father    Asthma Brother    Diabetes Maternal Grandmother    Arthritis Maternal Grandmother    Depression Maternal Grandmother    Arthritis Maternal Grandfather    Depression Maternal Grandfather    Cancer Paternal Grandmother    Uterine cancer Paternal Grandmother    Arthritis Paternal Grandfather    Esophageal cancer Neg Hx    Liver disease Neg Hx     Review of Systems: ROS  Physical Exam: Vital Signs There were no vitals taken for this visit.  Physical Exam ***  Constitutional:      General: Not in acute distress.    Appearance: Normal appearance. Not ill-appearing.  HENT:     Head: Normocephalic and atraumatic.  Eyes:     Pupils: Pupils are equal, round Neck:     Musculoskeletal: Normal range of motion.  Cardiovascular:     Rate and Rhythm: Normal rate    Pulses: Normal pulses.  Pulmonary:     Effort: Pulmonary effort is normal. No respiratory distress.  Musculoskeletal: Normal range of motion.  Skin:    General: Skin is warm and dry.     Findings: No erythema or rash.  Neurological:     General: No focal deficit present.     Mental Status: Alert and oriented to person, place, and time. Mental status is at baseline.     Motor: No weakness.  Psychiatric:        Mood and Affect: Mood normal.        Behavior: Behavior normal.    Assessment/Plan: The patient is scheduled for bilateral breast reduction with Dr. {[id number]::"VZDGLO","VFIEPPIRJJ"}.  Risks, benefits, and alternatives of procedure discussed, questions answered and consent obtained.    Smoking Status: ***; Counseling Given? *** Last Mammogram: ***; Results: ***  Caprini Score: ***; Risk Factors include: ***, BMI *** 25, and length of planned surgery. Recommendation for mechanical *** pharmacological prophylaxis. Encourage early ambulation.   Pictures obtained: @consult ***  Post-op Rx sent to pharmacy:  {Blank:19197::"Oxycodone, Zofran, Keflex","Oxycodone, Zofran"}  Patient was provided with the breast reduction and General Surgical Risk consent document and Pain Medication Agreement prior to their appointment.  They had adequate time to read through the risk consent documents and Pain Medication Agreement. We also discussed them in person together during this preop appointment. All of their questions were answered to their satisfaction.  Recommended calling if they have any further questions.  Risk consent form and Pain Medication Agreement to be scanned into patient's chart.  The risk that can be encountered with breast reduction were discussed and include the following but not limited to these:  Breast asymmetry, fluid accumulation, firmness of the breast, inability to breast feed, loss of nipple or areola, skin loss, decrease or no nipple sensation, fat necrosis of the breast tissue, bleeding, infection, healing delay.  There are risks of anesthesia, changes to skin sensation and injury to nerves or blood vessels.  The muscle can be temporarily or permanently injured.  You may have an allergic reaction to tape, suture, glue, blood products which can result in skin discoloration, swelling, pain, skin lesions, poor healing.  Any of these can lead to the need for revisonal surgery or stage procedures.  A reduction has potential to interfere with diagnostic procedures.  Nipple or breast piercing can increase risks of infection.  This procedure is best done when the breast is fully developed.  Changes in the breast will continue to occur over time.  Pregnancy can alter the outcomes of previous breast reduction surgery, weight gain and weigh loss can also effect the long term appearance.     Electronically signed by: Kermit Balo Barak Bialecki, PA-C 05/15/2023 2:36 PM

## 2023-05-16 ENCOUNTER — Encounter: Payer: Self-pay | Admitting: Surgical

## 2023-05-16 ENCOUNTER — Ambulatory Visit: Payer: BC Managed Care – PPO | Admitting: Surgical

## 2023-05-16 VITALS — BP 119/73 | HR 120 | Wt 159.0 lb

## 2023-05-16 DIAGNOSIS — N62 Hypertrophy of breast: Secondary | ICD-10-CM

## 2023-05-16 MED ORDER — ONDANSETRON HCL 4 MG PO TABS: 4.0000 mg | ORAL_TABLET | Freq: Three times a day (TID) | ORAL | 0 refills | Status: AC | PRN

## 2023-05-16 MED ORDER — OXYCODONE HCL 5 MG PO TABS: 5.0000 mg | ORAL_TABLET | Freq: Four times a day (QID) | ORAL | 0 refills | Status: AC | PRN

## 2023-05-21 ENCOUNTER — Encounter (HOSPITAL_BASED_OUTPATIENT_CLINIC_OR_DEPARTMENT_OTHER): Payer: Self-pay | Admitting: Plastic Surgery

## 2023-05-21 ENCOUNTER — Other Ambulatory Visit: Payer: Self-pay

## 2023-05-21 NOTE — Progress Notes (Signed)
   05/21/23 1117  PAT Phone Screen  Is the patient taking a GLP-1 receptor agonist? No  Do You Have Diabetes? No  Do You Have Hypertension? No  Have You Ever Been to the ER for Asthma? No  Have You Taken Oral Steroids in the Past 3 Months? (S)  Yes (05/02/23 prednisone burst pack for asthma exacerbation)  Do you Take Phenteramine or any Other Diet Drugs? No  Recent  Lab Work, EKG, CXR? No  Do you have a history of heart problems? No  Any Recent Hospitalizations? No  Height 5\' 4"  (1.626 m)  Weight 72 kg  Pat Appointment Scheduled No  Reason for No Appointment Not Needed   Reviewed pt's pmh of asthma and OV w/ PCP on 05/02/23 with Dr Chaney Malling. Ok to proceed w/ surgery on 05/28/23 as planned. Pt now using Qvar daily and states that she has had no issues with her breathing since completing the prednisone. Will use Qvar dos and bring albuterol inhaler with her.

## 2023-05-27 NOTE — Anesthesia Preprocedure Evaluation (Signed)
 Anesthesia Evaluation  Patient identified by MRN, date of birth, ID band Patient awake    Reviewed: Allergy & Precautions, NPO status , Patient's Chart, lab work & pertinent test results  History of Anesthesia Complications Negative for: history of anesthetic complications  Airway Mallampati: III  TM Distance: >3 FB Neck ROM: Full    Dental  (+) Dental Advisory Given   Pulmonary neg shortness of breath, asthma (completed steroid burst 04/2023, used Qvar this morning) , neg sleep apnea, neg COPD, neg recent URI   Pulmonary exam normal breath sounds clear to auscultation       Cardiovascular negative cardio ROS  Rhythm:Regular Rate:Normal     Neuro/Psych  PSYCHIATRIC DISORDERS (OCD) Anxiety Depression    negative neurological ROS     GI/Hepatic Neg liver ROS,GERD  ,,  Endo/Other  negative endocrine ROS    Renal/GU negative Renal ROS     Musculoskeletal   Abdominal   Peds  Hematology negative hematology ROS (+)   Anesthesia Other Findings   Reproductive/Obstetrics Endometriosis                              Anesthesia Physical Anesthesia Plan  ASA: 2  Anesthesia Plan: General   Post-op Pain Management: Tylenol PO (pre-op)*   Induction: Intravenous  PONV Risk Score and Plan: Ondansetron, Dexamethasone, Midazolam, Scopolamine patch - Pre-op and Treatment may vary due to age or medical condition  Airway Management Planned: Oral ETT  Additional Equipment:   Intra-op Plan:   Post-operative Plan: Extubation in OR  Informed Consent: I have reviewed the patients History and Physical, chart, labs and discussed the procedure including the risks, benefits and alternatives for the proposed anesthesia with the patient or authorized representative who has indicated his/her understanding and acceptance.     Dental advisory given  Plan Discussed with: CRNA and Anesthesiologist  Anesthesia  Plan Comments: (Risks of general anesthesia discussed including, but not limited to, sore throat, hoarse voice, chipped/damaged teeth, injury to vocal cords, nausea and vomiting, allergic reactions, lung infection, heart attack, stroke, and death. All questions answered. )        Anesthesia Quick Evaluation

## 2023-05-28 ENCOUNTER — Ambulatory Visit (HOSPITAL_BASED_OUTPATIENT_CLINIC_OR_DEPARTMENT_OTHER)
Admission: RE | Admit: 2023-05-28 | Discharge: 2023-05-28 | Disposition: A | Discharge status: Home / Self Care | Payer: BC Managed Care – PPO | Attending: Plastic Surgery | Admitting: Plastic Surgery

## 2023-05-28 ENCOUNTER — Ambulatory Visit (HOSPITAL_BASED_OUTPATIENT_CLINIC_OR_DEPARTMENT_OTHER): Payer: Self-pay | Admitting: Anesthesiology

## 2023-05-28 ENCOUNTER — Encounter (HOSPITAL_BASED_OUTPATIENT_CLINIC_OR_DEPARTMENT_OTHER)
Admission: RE | Disposition: A | Discharge status: Home / Self Care | Payer: Self-pay | Source: Home / Self Care | Attending: Plastic Surgery

## 2023-05-28 ENCOUNTER — Other Ambulatory Visit: Payer: Self-pay

## 2023-05-28 ENCOUNTER — Encounter (HOSPITAL_BASED_OUTPATIENT_CLINIC_OR_DEPARTMENT_OTHER): Payer: Self-pay | Admitting: Plastic Surgery

## 2023-05-28 DIAGNOSIS — N62 Hypertrophy of breast: Secondary | ICD-10-CM | POA: Insufficient documentation

## 2023-05-28 DIAGNOSIS — J45909 Unspecified asthma, uncomplicated: Secondary | ICD-10-CM | POA: Diagnosis not present

## 2023-05-28 DIAGNOSIS — K219 Gastro-esophageal reflux disease without esophagitis: Secondary | ICD-10-CM | POA: Insufficient documentation

## 2023-05-28 DIAGNOSIS — Z7951 Long term (current) use of inhaled steroids: Secondary | ICD-10-CM | POA: Diagnosis not present

## 2023-05-28 DIAGNOSIS — Z01818 Encounter for other preprocedural examination: Secondary | ICD-10-CM

## 2023-05-28 HISTORY — PX: BREAST REDUCTION SURGERY: SHX8

## 2023-05-28 LAB — POCT PREGNANCY, URINE: Preg Test, Ur: NEGATIVE

## 2023-05-28 SURGERY — MAMMOPLASTY, REDUCTION
Anesthesia: General | Site: Breast | Laterality: Bilateral

## 2023-05-28 MED ORDER — PROPOFOL 500 MG/50ML IV EMUL
INTRAVENOUS | Status: DC | PRN
  Administered 2023-05-28: 50 ug/kg/min via INTRAVENOUS

## 2023-05-28 MED ORDER — FENTANYL CITRATE (PF) 100 MCG/2ML IJ SOLN
INTRAMUSCULAR | Status: AC
  Filled 2023-05-28: qty 2

## 2023-05-28 MED ORDER — SODIUM CHLORIDE (PF) 0.9 % IJ SOLN
INTRAMUSCULAR | Status: AC
  Filled 2023-05-28: qty 100

## 2023-05-28 MED ORDER — LACTATED RINGERS IV SOLN: INTRAVENOUS | Status: DC

## 2023-05-28 MED ORDER — BUPIVACAINE LIPOSOME 1.3 % IJ SUSP
INTRAMUSCULAR | Status: AC
  Filled 2023-05-28: qty 20

## 2023-05-28 MED ORDER — SCOPOLAMINE 1 MG/3DAYS TD PT72
1.0000 | MEDICATED_PATCH | TRANSDERMAL | Status: DC
  Administered 2023-05-28: 1.5 mg via TRANSDERMAL

## 2023-05-28 MED ORDER — BUPIVACAINE HCL (PF) 0.25 % IJ SOLN
INTRAMUSCULAR | Status: AC
  Filled 2023-05-28: qty 60

## 2023-05-28 MED ORDER — ALBUMIN HUMAN 5 % IV SOLN: INTRAVENOUS | Status: DC | PRN

## 2023-05-28 MED ORDER — PHENYLEPHRINE HCL (PRESSORS) 10 MG/ML IV SOLN
INTRAVENOUS | Status: DC | PRN
  Administered 2023-05-28 (×3): 80 ug via INTRAVENOUS
  Administered 2023-05-28: 160 ug via INTRAVENOUS

## 2023-05-28 MED ORDER — OXYCODONE HCL 5 MG/5ML PO SOLN: 5.0000 mg | Freq: Once | ORAL | Status: AC | PRN

## 2023-05-28 MED ORDER — AMISULPRIDE (ANTIEMETIC) 5 MG/2ML IV SOLN
INTRAVENOUS | Status: AC
  Filled 2023-05-28: qty 4

## 2023-05-28 MED ORDER — MIDAZOLAM HCL 2 MG/2ML IJ SOLN
INTRAMUSCULAR | Status: AC
  Filled 2023-05-28: qty 2

## 2023-05-28 MED ORDER — ACETAMINOPHEN 500 MG PO TABS
1000.0000 mg | ORAL_TABLET | Freq: Once | ORAL | Status: AC
  Administered 2023-05-28: 1000 mg via ORAL

## 2023-05-28 MED ORDER — 0.9 % SODIUM CHLORIDE (POUR BTL) OPTIME
TOPICAL | Status: DC | PRN
  Administered 2023-05-28 (×3): 1000 mL

## 2023-05-28 MED ORDER — DEXAMETHASONE SODIUM PHOSPHATE 4 MG/ML IJ SOLN
INTRAMUSCULAR | Status: DC | PRN
  Administered 2023-05-28: 10 mg via INTRAVENOUS

## 2023-05-28 MED ORDER — ROCURONIUM BROMIDE 100 MG/10ML IV SOLN
INTRAVENOUS | Status: DC | PRN
  Administered 2023-05-28: 20 mg via INTRAVENOUS
  Administered 2023-05-28: 10 mg via INTRAVENOUS
  Administered 2023-05-28: 50 mg via INTRAVENOUS

## 2023-05-28 MED ORDER — LIDOCAINE HCL (CARDIAC) PF 100 MG/5ML IV SOSY
PREFILLED_SYRINGE | INTRAVENOUS | Status: DC | PRN
  Administered 2023-05-28: 80 mg via INTRAVENOUS

## 2023-05-28 MED ORDER — ONDANSETRON HCL 4 MG/2ML IJ SOLN
INTRAMUSCULAR | Status: DC | PRN
  Administered 2023-05-28: 4 mg via INTRAVENOUS

## 2023-05-28 MED ORDER — AMISULPRIDE (ANTIEMETIC) 5 MG/2ML IV SOLN: 10.0000 mg | Freq: Once | INTRAVENOUS | Status: DC | PRN

## 2023-05-28 MED ORDER — HYDROMORPHONE HCL 1 MG/ML IJ SOLN
INTRAMUSCULAR | Status: AC
  Filled 2023-05-28: qty 0.5

## 2023-05-28 MED ORDER — OXYCODONE HCL 5 MG PO TABS
ORAL_TABLET | ORAL | Status: AC
  Filled 2023-05-28: qty 1

## 2023-05-28 MED ORDER — CHLORHEXIDINE GLUCONATE CLOTH 2 % EX PADS: 6.0000 | MEDICATED_PAD | Freq: Once | CUTANEOUS | Status: DC

## 2023-05-28 MED ORDER — SCOPOLAMINE 1 MG/3DAYS TD PT72
MEDICATED_PATCH | TRANSDERMAL | Status: AC
  Filled 2023-05-28: qty 1

## 2023-05-28 MED ORDER — ACETAMINOPHEN 500 MG PO TABS
ORAL_TABLET | ORAL | Status: AC
  Filled 2023-05-28: qty 2

## 2023-05-28 MED ORDER — CEFAZOLIN SODIUM-DEXTROSE 2-4 GM/100ML-% IV SOLN
INTRAVENOUS | Status: AC
  Filled 2023-05-28: qty 100

## 2023-05-28 MED ORDER — CEFAZOLIN SODIUM-DEXTROSE 2-4 GM/100ML-% IV SOLN
2.0000 g | INTRAVENOUS | Status: AC
  Administered 2023-05-28: 2 g via INTRAVENOUS

## 2023-05-28 MED ORDER — PROPOFOL 10 MG/ML IV BOLUS
INTRAVENOUS | Status: AC
  Filled 2023-05-28: qty 20

## 2023-05-28 MED ORDER — DEXMEDETOMIDINE HCL IN NACL 80 MCG/20ML IV SOLN
INTRAVENOUS | Status: DC | PRN
Start: 2023-05-28 — End: 2023-05-28
  Administered 2023-05-28: 8 ug via INTRAVENOUS
  Administered 2023-05-28: 4 ug via INTRAVENOUS

## 2023-05-28 MED ORDER — OXYCODONE HCL 5 MG PO TABS
5.0000 mg | ORAL_TABLET | Freq: Once | ORAL | Status: AC | PRN
  Administered 2023-05-28: 5 mg via ORAL

## 2023-05-28 MED ORDER — HYDROMORPHONE HCL 1 MG/ML IJ SOLN
INTRAMUSCULAR | Status: DC | PRN
  Administered 2023-05-28 (×2): .5 mg via INTRAVENOUS

## 2023-05-28 MED ORDER — SUGAMMADEX SODIUM 200 MG/2ML IV SOLN
INTRAVENOUS | Status: DC | PRN
  Administered 2023-05-28: 200 mg via INTRAVENOUS

## 2023-05-28 MED ORDER — FENTANYL CITRATE (PF) 100 MCG/2ML IJ SOLN
25.0000 ug | INTRAMUSCULAR | Status: DC | PRN
  Administered 2023-05-28 (×3): 50 ug via INTRAVENOUS

## 2023-05-28 MED ORDER — MIDAZOLAM HCL 2 MG/2ML IJ SOLN
INTRAMUSCULAR | Status: DC | PRN
  Administered 2023-05-28: 2 mg via INTRAVENOUS

## 2023-05-28 MED ORDER — FENTANYL CITRATE (PF) 100 MCG/2ML IJ SOLN
INTRAMUSCULAR | Status: DC | PRN
Start: 2023-05-28 — End: 2023-05-28
  Administered 2023-05-28 (×2): 50 ug via INTRAVENOUS

## 2023-05-28 MED ORDER — SODIUM CHLORIDE (PF) 0.9 % IJ SOLN
INTRAMUSCULAR | Status: DC | PRN
  Administered 2023-05-28: 100 mL

## 2023-05-28 MED ORDER — HYDROMORPHONE HCL 1 MG/ML IJ SOLN
INTRAMUSCULAR | Status: AC
Start: 2023-05-28 — End: ?
  Filled 2023-05-28: qty 0.5

## 2023-05-28 MED ORDER — PROPOFOL 10 MG/ML IV BOLUS
INTRAVENOUS | Status: DC | PRN
Start: 2023-05-28 — End: 2023-05-28
  Administered 2023-05-28: 150 mg via INTRAVENOUS

## 2023-05-28 SURGICAL SUPPLY — 59 items
BINDER BREAST 3XL (GAUZE/BANDAGES/DRESSINGS) IMPLANT
BINDER BREAST LRG (GAUZE/BANDAGES/DRESSINGS) IMPLANT
BINDER BREAST MEDIUM (GAUZE/BANDAGES/DRESSINGS) IMPLANT
BINDER BREAST XLRG (GAUZE/BANDAGES/DRESSINGS) IMPLANT
BINDER BREAST XXLRG (GAUZE/BANDAGES/DRESSINGS) IMPLANT
BIOPATCH RED 1 DISK 7.0 (GAUZE/BANDAGES/DRESSINGS) ×2 IMPLANT
BLADE HEX COATED 2.75 (ELECTRODE) IMPLANT
BLADE SURG 10 STRL SS (BLADE) ×6 IMPLANT
BLADE SURG 15 STRL LF DISP TIS (BLADE) ×1 IMPLANT
CANISTER SUCT 1200ML W/VALVE (MISCELLANEOUS) ×1 IMPLANT
DERMABOND ADVANCED .7 DNX12 (GAUZE/BANDAGES/DRESSINGS) ×2 IMPLANT
DRAIN CHANNEL 19F RND (DRAIN) ×2 IMPLANT
DRAPE IMP U-DRAPE 54X76 (DRAPES) IMPLANT
DRAPE UTILITY XL STRL (DRAPES) ×1 IMPLANT
DRSG TEGADERM 4X4.75 (GAUZE/BANDAGES/DRESSINGS) ×2 IMPLANT
ELECT BLADE 4.0 EZ CLEAN MEGAD (MISCELLANEOUS) ×1 IMPLANT
ELECT REM PT RETURN 9FT ADLT (ELECTROSURGICAL) ×2 IMPLANT
ELECTRODE BLDE 4.0 EZ CLN MEGD (MISCELLANEOUS) ×1 IMPLANT
ELECTRODE REM PT RTRN 9FT ADLT (ELECTROSURGICAL) ×2 IMPLANT
EVACUATOR SILICONE 100CC (DRAIN) ×2 IMPLANT
GAUZE PAD ABD 8X10 STRL (GAUZE/BANDAGES/DRESSINGS) ×2 IMPLANT
GAUZE SPONGE 2X2 STRL 8-PLY (GAUZE/BANDAGES/DRESSINGS) ×2 IMPLANT
GLOVE BIO SURGEON STRL SZ 6.5 (GLOVE) IMPLANT
GLOVE BIO SURGEON STRL SZ7.5 (GLOVE) IMPLANT
GLOVE BIO SURGEON STRL SZ8 (GLOVE) ×1 IMPLANT
GLOVE BIOGEL PI IND STRL 7.0 (GLOVE) IMPLANT
GLOVE BIOGEL PI IND STRL 8 (GLOVE) ×1 IMPLANT
GOWN STRL REUS W/ TWL LRG LVL3 (GOWN DISPOSABLE) ×1 IMPLANT
GOWN STRL REUS W/TWL XL LVL3 (GOWN DISPOSABLE) ×1 IMPLANT
HEMOSTAT ARISTA ABSORB 3G PWDR (HEMOSTASIS) IMPLANT
HIBICLENS CHG 4% 4OZ BTL (MISCELLANEOUS) ×1 IMPLANT
MANIFOLD NEPTUNE II (INSTRUMENTS) IMPLANT
MARKER SKIN DUAL TIP RULER LAB (MISCELLANEOUS) ×1 IMPLANT
NDL HYPO 22X1.5 SAFETY MO (MISCELLANEOUS) ×2 IMPLANT
NEEDLE HYPO 22X1.5 SAFETY MO (MISCELLANEOUS) ×2 IMPLANT
NS IRRIG 1000ML POUR BTL (IV SOLUTION) ×1 IMPLANT
PACK BASIN DAY SURGERY FS (CUSTOM PROCEDURE TRAY) ×1 IMPLANT
PACK UNIVERSAL I (CUSTOM PROCEDURE TRAY) ×1 IMPLANT
PENCIL SMOKE EVACUATOR (MISCELLANEOUS) ×2 IMPLANT
PIN SAFETY STERILE (MISCELLANEOUS) ×1 IMPLANT
SLEEVE SCD COMPRESS KNEE MED (STOCKING) ×1 IMPLANT
SPONGE T-LAP 18X18 ~~LOC~~+RFID (SPONGE) ×3 IMPLANT
STAPLER SKIN PROX WIDE 3.9 (STAPLE) ×1 IMPLANT
STRIP CLOSURE SKIN 1/2X4 (GAUZE/BANDAGES/DRESSINGS) IMPLANT
SUT MNCRL AB 3-0 PS2 27 (SUTURE) ×4 IMPLANT
SUT MNCRL AB 4-0 PS2 18 (SUTURE) ×4 IMPLANT
SUT MON AB 2-0 CT1 36 (SUTURE) ×1 IMPLANT
SUT MON AB 5-0 PS2 18 (SUTURE) IMPLANT
SUT SILK 2 0 SH (SUTURE) ×2 IMPLANT
SUT VIC AB 3-0 SH 27X BRD (SUTURE) IMPLANT
SYR 20ML LL LF (SYRINGE) ×2 IMPLANT
SYR BULB IRRIG 60ML STRL (SYRINGE) ×1 IMPLANT
SYR CONTROL 10ML LL (SYRINGE) ×1 IMPLANT
TAPE MEASURE VINYL STERILE (MISCELLANEOUS) IMPLANT
TOWEL GREEN STERILE FF (TOWEL DISPOSABLE) ×2 IMPLANT
TRAY DSU PREP LF (CUSTOM PROCEDURE TRAY) ×1 IMPLANT
TUBE CONNECTING 20X1/4 (TUBING) ×1 IMPLANT
UNDERPAD 30X36 HEAVY ABSORB (UNDERPADS AND DIAPERS) ×2 IMPLANT
YANKAUER SUCT BULB TIP NO VENT (SUCTIONS) ×1 IMPLANT

## 2023-05-28 NOTE — Discharge Instructions (Addendum)
 INSTRUCTIONS FOR AFTER BREAST SURGERY   You will likely have some questions about what to expect following your operation.  The following information will help you and your family understand what to expect when you are discharged from the hospital.  It is important to follow these guidelines to help ensure a smooth recovery and reduce complication.  Postoperative instructions include information on: diet, wound care, medications and physical activity.  AFTER SURGERY Expect to go home after the procedure.  In some cases, you may need to spend one night in the hospital for observation.  DIET Breast surgery does not require a specific diet.  However, the healthier you eat the better your body will heal. It is important to increasing your protein intake.  This means limiting the foods with sugar and carbohydrates.  Focus on vegetables and some meat.  If you have liposuction during your procedure be sure to drink water.  If your urine is bright yellow, then it is concentrated, and you need to drink more water.  As a general rule after surgery, you should have 8 ounces of water every hour while awake.  If you find you are persistently nauseated or unable to take in liquids let us know.  NO TOBACCO USE or EXPOSURE.  This will slow your healing process and lead to a wound.  WOUND CARE Leave the binder on at all times except when showering . Use fragrance free soap like Dial, Dove or [place].   After 24 hours you can remove the binder to shower. Once dry apply binder or sports bra. No baths, pools or hot tubs for four weeks. We close your incision to leave the smallest and best-looking scar. No ointment or creams on your incisions for four weeks.  No Neosporin (Too many skin reactions).  A few weeks after surgery you can use Mederma and start massaging the scar. We ask you to wear your binder or sports bra for the first 6 weeks around the clock, including while sleeping. This provides added comfort and helps  reduce the fluid accumulation at the surgery site. NO Ice or heating pads to the operative site.  You have a very high risk of a BURN before you feel the temperature change. Continue to empty, recharge, & record drainage from drains 2-3 times a day, as needed.   ACTIVITY No heavy lifting until cleared by the doctor.  This usually means no more than a half-gallon of milk.  It is OK to walk and climb stairs. Moving your legs is very important to decrease your risk of a blood clot.  It will also help keep you from getting deconditioned.  Every 1 to 2 hours get up and walk for 5 minutes. This will help with a quicker recovery back to normal.  Let pain be your guide so you don't do too much.  This time is for you to recover.  You will be more comfortable if you sleep and rest with your head elevated either with a few pillows under you or in a recliner.  No stomach sleeping for a three months.  WORK Everyone returns to work at different times. As a rough guide, most people take at least 1 - 2 weeks off prior to returning to work. If you need documentation for your job, give the forms to the front staff at the clinic.  DRIVING Arrange for someone to bring you home from the hospital after your surgery.  You may be able to drive a few days  after surgery but not while taking any narcotics or valium.  BOWEL MOVEMENTS Constipation can occur after anesthesia and while taking pain medication.  It is important to stay ahead for your comfort.  We recommend taking Milk of Magnesia (2 tablespoons; twice a day) while taking the pain pills.  MEDICATIONS You may be prescribed should start after surgery At your preoperative visit for you history and physical you may have been given the following medications: Zofran 4 mg:  This is to treat nausea and vomiting.  You can take this every 6 hours as needed and only if needed. Oxycodone 5 mg:  This is only to be used after you have taken the Motrin or the Tylenol. Every 8  hours as needed.   Over the counter Medication to take: Ibuprofen (Motrin) 600 mg:  Take this every 6 hours.  If you have additional pain then take 500 mg of the Tylenol every 8 hours.  Only take the Norco after you have tried these two. MiraLAX or Milk of Magnesia: Take this according to the bottle if you take the Norco.  WHEN TO CALL Call your surgeon's office if any of the following occur: Fever 101 degrees F or greater Excessive bleeding or fluid from the incision site. Pain that increases over time without aid from the medications Redness, warmth, or pus draining from incision sites Persistent nausea or inability to take in liquids Severe misshapen area that underwent the operation.  Here are some resources for breast cancer patients:  Plastic surgery website: https://www.plasticsurgery.org/for-medical-professionals/education-and-resources/publications/breast-reconstruction-magazine Breast Reconstruction Awareness Campaign:  ChessContest.fr Plastic surgery Implant information:  https://www.plasticsurgery.org/patient-safety/breast-implant-safety     Post Anesthesia Home Care Instructions  Activity: Get plenty of rest for the remainder of the day. A responsible individual must stay with you for 24 hours following the procedure.  For the next 24 hours, DO NOT: -Drive a car -Advertising copywriter -Drink alcoholic beverages -Take any medication unless instructed by your physician -Make any legal decisions or sign important papers.  Meals: Start with liquid foods such as gelatin or soup. Progress to regular foods as tolerated. Avoid greasy, spicy, heavy foods. If nausea and/or vomiting occur, drink only clear liquids until the nausea and/or vomiting subsides. Call your physician if vomiting continues.  Special Instructions/Symptoms: Your throat may feel dry or sore from the anesthesia or the breathing tube placed in your throat during surgery. If this causes  discomfort, gargle with warm salt water. The discomfort should disappear within 24 hours.  If you had a scopolamine patch placed behind your ear for the management of post- operative nausea and/or vomiting:  1. The medication in the patch is effective for 72 hours, after which it should be removed.  Wrap patch in a tissue and discard in the trash. Wash hands thoroughly with soap and water. 2. You may remove the patch earlier than 72 hours if you experience unpleasant side effects which may include dry mouth, dizziness or visual disturbances. 3. Avoid touching the patch. Wash your hands with soap and water after contact with the patch.      Information for Discharge Teaching: EXPAREL (bupivacaine liposome injectable suspension)   Pain relief is important to your recovery. The goal is to control your pain so you can move easier and return to your normal activities as soon as possible after your procedure. Your physician may use several types of medicines to manage pain, swelling, and more.  Your surgeon or anesthesiologist gave you EXPAREL(bupivacaine) to help control your pain after surgery.  EXPAREL is a local anesthetic designed to release slowly over an extended period of time to provide pain relief by numbing the tissue around the surgical site. EXPAREL is designed to release pain medication over time and can control pain for up to 72 hours. Depending on how you respond to EXPAREL, you may require less pain medication during your recovery. EXPAREL can help reduce or eliminate the need for opioids during the first few days after surgery when pain relief is needed the most. EXPAREL is not an opioid and is not addictive. It does not cause sleepiness or sedation.   Important! A teal colored band has been placed on your arm with the date, time and amount of EXPAREL you have received. Please leave this armband in place for the full 96 hours following administration, and then you may remove the  band. If you return to the hospital for any reason within 96 hours following the administration of EXPAREL, the armband provides important information that your health care providers to know, and alerts them that you have received this anesthetic.    Possible side effects of EXPAREL: Temporary loss of sensation or ability to move in the area where medication was injected. Nausea, vomiting, constipation Rarely, numbness and tingling in your mouth or lips, lightheadedness, or anxiety may occur. Call your doctor right away if you think you may be experiencing any of these sensations, or if you have other questions regarding possible side effects.  Follow all other discharge instructions given to you by your surgeon or nurse. Eat a healthy diet and drink plenty of water or other fluids.   About my Jackson-Pratt Bulb Drain  What is a Jackson-Pratt bulb? A Jackson-Pratt is a soft, round device used to collect drainage. It is connected to a long, thin drainage catheter, which is held in place by one or two small stiches near your surgical incision site. When the bulb is squeezed, it forms a vacuum, forcing the drainage to empty into the bulb.  Emptying the Jackson-Pratt bulb- To empty the bulb: 1. Release the plug on the top of the bulb. 2. Pour the bulb's contents into a measuring container which your nurse will provide. 3. Record the time emptied and amount of drainage. Empty the drain(s) as often as your     doctor or nurse recommends.  Date                  Time                    Amount (Drain 1)                 Amount (Drain  2)  _____________________________________________________________________  _____________________________________________________________________  _____________________________________________________________________  _____________________________________________________________________  _____________________________________________________________________  _____________________________________________________________________  _____________________________________________________________________  _____________________________________________________________________  Squeezing the Jackson-Pratt Bulb- To squeeze the bulb: 1. Make sure the plug at the top of the bulb is open. 2. Squeeze the bulb tightly in your fist. You will hear air squeezing from the bulb. 3. Replace the plug while the bulb is squeezed. 4. Use a safety pin to attach the bulb to your clothing. This will keep the catheter from     pulling at the bulb insertion site.  When to call your doctor- Call your doctor if: Drain site becomes red, swollen or hot. You have a fever greater than 101 degrees F. There is oozing at the drain site. Drain falls out (apply a guaze bandage over the drain hole and secure it with  tape). Drainage increases daily not related to activity patterns. (You will usually have more drainage when you are active than when you are resting.) Drainage has a bad odor.   Next dose of Tylenol may be given at 1:45pm if needed.

## 2023-05-28 NOTE — Interval H&P Note (Signed)
 History and Physical Interval Note: No change in exam or indication for surgery All questions answered. Marked for a bilateral breast reduction with her assistance. Will proceed at her request  05/28/2023 8:59 AM  Ann Vaughan  has presented today for surgery, with the diagnosis of Macromastia.  The various methods of treatment have been discussed with the patient and family. After consideration of risks, benefits and other options for treatment, the patient has consented to  Procedure(s): bilateral breast reduction (Bilateral) as a surgical intervention.  The patient's history has been reviewed, patient examined, no change in status, stable for surgery.  I have reviewed the patient's chart and labs.  Questions were answered to the patient's satisfaction.     [name]

## 2023-05-28 NOTE — Anesthesia Postprocedure Evaluation (Signed)
 Anesthesia Post Note  Patient: Ann Vaughan  Procedure(s) Performed: bilateral breast reduction (Bilateral: Breast)     Patient location during evaluation: PACU Anesthesia Type: General Level of consciousness: awake Pain management: pain level controlled Vital Signs Assessment: post-procedure vital signs reviewed and stable Respiratory status: spontaneous breathing, nonlabored ventilation and respiratory function stable Cardiovascular status: blood pressure returned to baseline and stable Postop Assessment: no apparent nausea or vomiting Anesthetic complications: no   No notable events documented.  Last Vitals:  Vitals:   05/28/23 1315 05/28/23 1322  BP: 135/78 126/72  Pulse: (!) 108 (!) 103  Resp: 16 15  Temp:    SpO2: 96% 95%    Last Pain:  Vitals:   05/28/23 1312  TempSrc:   PainSc: 3                  Conard Decent

## 2023-05-28 NOTE — Op Note (Signed)
 DATE OF OPERATION: 05/28/2023  LOCATION: [facility] operating Room  PREOPERATIVE DIAGNOSIS: Symptomatic macromastia  POSTOPERATIVE DIAGNOSIS: Same  PROCEDURE: Bilateral breast reduction  SURGEON: Kurtis Philips, MD  ASSISTANT: Matt Scheeler  EBL: 100 cc  CONDITION: Stable  COMPLICATIONS: None  INDICATION: The patient, Ann Vaughan, is a 18 y.o. female born on 07-20-2005, is here for treatment upper back and neck pain secondary to large breast size.   PROCEDURE DETAILS:  The patient was seen prior to surgery and marked.  The IV antibiotics were given. The patient was taken to the operating room and given a general anesthetic. A standard time out was performed and all information was confirmed by those in the room. SCDs were placed.   The chest was prepped and draped in usual sterile manner.  A 42 mm cookie cutter was used to outline the proposed nipple areolar complexes bilaterally and an 8 cm based inferior pedicle was outlined on each breast.  The right breast was addressed first.  Laparotomy tape was placed at the base of breast as a tourniquet and the pedicle was de-epithelialized sharply.  Electrocautery was used to dissect the borders of the pedicle to the chest wall.  Electrocautery was then used to resect the medial lateral and superior tragal suppressed tissue and to develop within the superior skin flap.  The breast tissue removed because to the bulk of the reduction weighed 760 g.  The subfascial space over the pectoralis muscle in the subcutaneous tissues were infiltrated with 50 mL of a mixture of Exparel, quarter percent plain Marcaine, and saline.  Surgical wound was irrigated and hemostasis ensured with electrocautery.  A 19 French round drain was placed behind the pedicle and brought out through a separate stab incision.  The T point was approximated with a single 2-0 Monocryl suture and the skin edges were tailor tacked in place with skin clips.  Dermis was approximated  with interrupted and running 3-0 Monocryl sutures and the skin was closed with a running 4-0 Monocryl subcuticular stitch.  Attention was turned to the left breast where similar procedure was performed.  After placing a laparotomy tape at the base of the breast as a tourniquet the pedicle was de-epithelialized sharply and dissected to the chest wall with electrocautery.  Medial lateral and superior triangles of breast tissue were resected and the superior skin flap was developed and thin.  The breast tissue removed because to the bulk of the reduction weight 630 g.  All breast tissue was sent to pathology for routine examination.  Again the subfascial space was infiltrated with the Exparel mixture in the surgical bed irrigated.  After ensuring hemostasis and 19 French round drain was placed behind the pedicle and brought out through a separate stab incision.  T point was approximated with a 2-0 Monocryl suture and the skin edges were tailor tacked in place with skin clips.  Dermis was closed with interrupted and running 3-0 Monocryl sutures and skin was closed with running 4-0 Monocryl subcuticular stitch.  All incisions were then sealed with Dermabond the patient was placed in a supportive compressive garment.  She was awakened from anesthesia without incident and transferred to the recovery room in good condition.  All instrument needle and sponge counts were reported as correct and there were no complications appreciated during the procedure. The patient was allowed to wake up and taken to recovery room in stable condition at the end of the case. The family was notified at the end of the  case.   The advanced practice practitioner (APP) assisted throughout the case.  The APP was essential in retraction and counter traction when needed to make the case progress smoothly.  This retraction and assistance made it possible to see the tissue plans for the procedure.  The assistance was needed for blood control,  tissue re-approximation and assisted with closure of the incision site.

## 2023-05-28 NOTE — Transfer of Care (Signed)
 Immediate Anesthesia Transfer of Care Note  Patient: Ann Vaughan  Procedure(s) Performed: bilateral breast reduction (Bilateral: Breast)  Patient Location: PACU  Anesthesia Type:General  Level of Consciousness: awake, alert , and patient cooperative  Airway & Oxygen Therapy: Patient Spontanous Breathing and Patient connected to face mask oxygen  Post-op Assessment: Report given to RN and Post -op Vital signs reviewed and stable  Post vital signs: Reviewed and stable  Last Vitals:  Vitals Value Taken Time  BP 133/89 05/28/23 1238  Temp    Pulse 106 05/28/23 1241  Resp 17 05/28/23 1241  SpO2 100 % 05/28/23 1241  Vitals shown include unfiled device data.  Last Pain:  Vitals:   05/28/23 0742  TempSrc: Temporal  PainSc: 0-No pain         Complications: No notable events documented.

## 2023-05-28 NOTE — Anesthesia Procedure Notes (Signed)
 Procedure Name: Intubation Date/Time: 05/28/2023 9:33 AM  Performed by: Lonia Ro, CRNAPre-anesthesia Checklist: Patient identified, Emergency Drugs available, Suction available, Patient being monitored and Timeout performed Patient Re-evaluated:Patient Re-evaluated prior to induction Oxygen Delivery Method: Circle system utilized Preoxygenation: Pre-oxygenation with 100% oxygen Induction Type: IV induction Ventilation: Mask ventilation without difficulty Laryngoscope Size: Mac and 3 Grade View: Grade I Tube type: Oral Tube size: 7.0 mm Number of attempts: 1 Airway Equipment and Method: Stylet Placement Confirmation: ETT inserted through vocal cords under direct vision, positive ETCO2 and breath sounds checked- equal and bilateral Secured at: 22 cm Tube secured with: Tape Dental Injury: Teeth and Oropharynx as per pre-operative assessment

## 2023-05-29 ENCOUNTER — Ambulatory Visit (INDEPENDENT_AMBULATORY_CARE_PROVIDER_SITE_OTHER): Payer: BC Managed Care – PPO | Admitting: Surgical

## 2023-05-29 ENCOUNTER — Encounter (HOSPITAL_BASED_OUTPATIENT_CLINIC_OR_DEPARTMENT_OTHER): Payer: Self-pay | Admitting: Plastic Surgery

## 2023-05-29 DIAGNOSIS — N62 Hypertrophy of breast: Secondary | ICD-10-CM

## 2023-05-29 LAB — SURGICAL PATHOLOGY

## 2023-05-29 NOTE — Progress Notes (Signed)
 Patient is a 18 y.o.-year-old female status post bilateral breast reduction with Dr.  Carolynne Citron.  Postop day 1.  She is here with her mom.  She reports she is overall doing well, reports she is dealing with some bilateral breast pain, reports oxycodone has been minimally helpful.  She has been occasionally taking ibuprofen.  Reports JP drain output has been minimal, about 10 cc or so out since yesterday evening.  She is not having any fevers or chills.  She has been eating and drinking normally.  She does not have any specific concerns other than her pain.  She reports she is happy with her breast size at this time and feels as if they match her size much better.  Chaperone present on exam Bilateral NAC's are viable, bilateral breast incisions are intact. There is no erythema or cellulitic changes noted. No obvious subcutaneous fluid collections noted with palpation.   A/P:  Bilateral JP drains removed due to low output.  Patient tolerated this well.  There is no signs infection or concern on exam.  Recommend Tylenol 1000 mg every 8 hours and ibuprofen 600 every 6 hours for baseline pain control and using oxycodone as needed.  Recommend continuing with compressive garment 24/7 until 6 weeks post-op,  avoiding strenuous activity/heavy lifting until 6 weeks post-op  She is scheduled for follow-up on 06/05/2023.  Recommend calling the office if she needs assistance sooner.  All of the patient's questions were answered to their content. Recommend calling with any questions or concerns.  Pictures were obtained of the patient and placed in the chart with the patient's or guardian's permission.

## 2023-06-05 ENCOUNTER — Ambulatory Visit (INDEPENDENT_AMBULATORY_CARE_PROVIDER_SITE_OTHER): Payer: BC Managed Care – PPO | Admitting: Plastic Surgery

## 2023-06-05 DIAGNOSIS — Z9889 Other specified postprocedural states: Secondary | ICD-10-CM

## 2023-06-05 NOTE — Progress Notes (Signed)
 Ann Vaughan returns today accompanied by her mother.  She is postop day 8 from bilateral breast reduction.  She has no complaints other than itching along the incisions.  She is not taking any narcotic pain medicine since the day of surgery.  On examination all incisions are clean dry and intact.  Nipples are warm and well-perfused.  She has outstanding symmetry and shape.  She is instructed to begin scar massage next Tuesday and may use silicone based tapes or creams at that time if she likes.  She is instructed to continue wearing supportive garment.  No heavy lifting but she may use full range of motion in her shoulders.  She may return to any light activity she desires.  Keep scheduled appointment, return or call for any questions or concerns.

## 2023-06-07 ENCOUNTER — Ambulatory Visit: Payer: Self-pay

## 2023-06-07 ENCOUNTER — Telehealth: Payer: Self-pay

## 2023-06-07 NOTE — Telephone Encounter (Signed)
 Patient left 2 voice mails expressing concern for some insomnia that she's been experiencing for the past 29 hours. Patient sounded very high strung and worried. I called patient back and sent MyChart message that I would have Anastacio Balm, [place] give her a call back to discuss her concerns when she finished seeing a patient. She conveyed understanding.

## 2023-06-07 NOTE — Telephone Encounter (Signed)
 Chief Complaint: anxiety attack Symptoms: anxiety, lack of sleep Frequency: 2 days Pertinent Negatives: Patient denies SI, HI, fever, dizziness Disposition: [] ED /[x] Urgent Care (no appt availability in office) / [] Appointment(In office/virtual)/ []  [facility]/ [] Home Care/ [] Refused Recommended Disposition /[] [facility]/ []  Follow-up with PCP Additional Notes: Patient reports she has had a very difficult time sleeping the last 2 nights. Patient reports she has not slept in over 24 hours. Patient reports she did try all the things she knows to try like Melatonin and relaxation techniques but it is still not helping. Patient reports she has been under more stress than usual lately with a recent surgery and school. Patient reports when she lays down to sleep she has racing thoughts and sounds tearful during questioning with this RN. Patient denies current SI, HI, fever and dizziness. Attempted to schedule in office appt, no availability, so pt advised to go to UC at this time. Patient advised to call back with worsening symptoms. Patient verbalized understanding and states she will have her mom drive her to UC.   Copied from CRM [id number]. Topic: Clinical - Red Word Triage >> Jun 07, 2023  8:42 AM Kita Perish H wrote: [facility] that prompted transfer to Nurse Triage: Patient states she hasn't slept in 24 hours and feeling very confused started crying with agent. Reason for Disposition  MODERATE anxiety (e.g., persistent or frequent anxiety symptoms; interferes with sleep, school, or work)  Answer Assessment - Initial Assessment Questions 1. CONCERN: "Did anything happen that prompted you to call today?"      Lack of sleep and racing thoughts 2. ANXIETY SYMPTOMS: "Can you describe how you (your loved one; patient) have been feeling?" (e.g., tense, restless, panicky, anxious, keyed up, overwhelmed, sense of impending doom).      Restless, anxious,  3. ONSET: "How long have you  been feeling this way?" (e.g., hours, days, weeks)     2 days ago  4. SEVERITY: "How would you rate the level of anxiety?" (e.g., 0 - 10; or mild, moderate, severe).     moderate 5. FUNCTIONAL IMPAIRMENT: "How have these feelings affected your ability to do daily activities?" "Have you had more difficulty than usual doing your normal daily activities?" (e.g., getting better, same, worse; self-care, school, work, interactions)     worse 6. HISTORY: "Have you felt this way before?" "Have you ever been diagnosed with an anxiety problem in the past?" (e.g., generalized anxiety disorder, panic attacks, PTSD). If Yes, ask: "How was this problem treated?" (e.g., medicines, counseling, etc.)     Hx of trouble sleeping, MDD 7. RISK OF HARM - SUICIDAL IDEATION: "Do you ever have thoughts of hurting or killing yourself?" If Yes, ask:  "Do you have these feelings now?" "Do you have a plan on how you would do this?"     denies 8. TREATMENT:  "What has been done so far to treat this anxiety?" (e.g., medicines, relaxation strategies). "What has helped?"     Melatonin and relaxation strategies, lithium & lamotrigine 9. TREATMENT - THERAPIST: "Do you have a counselor or therapist? Name?"     denies 10. POTENTIAL TRIGGERS: "Do you drink caffeinated beverages (e.g., coffee, colas, teas), and how much daily?" "Do you drink alcohol or use any drugs?" "Have you started any new medicines recently?"       denies 11. PATIENT SUPPORT: "Who is with you now?" "Who do you live with?" "Do you have family or friends who you can talk to?"  Yes, mom and brother 19. OTHER SYMPTOMS: "Do you have any other symptoms?" (e.g., feeling depressed, trouble concentrating, trouble sleeping, trouble breathing, palpitations or fast heartbeat, chest pain, sweating, nausea, or diarrhea)       denies  Protocols used: Anxiety and Panic Attack-A-AH

## 2023-06-07 NOTE — Telephone Encounter (Signed)
 Called and spoke with the patient and her mother.  Patient's mother states that patient held her methylphenidate a few days prior to surgery and then recently restarted them.  She states that since she restarted them, patient has not been able to sleep.  Patient and her mother deny any issues with the surgical site.  They deny any fevers or chills.  She states that discomfort/pain is not the issue of why she cannot sleep.  I recommended that they reach out to their primary care provider as this could be an issue with her medication.  I also recommended that she make sure she is drinking plenty of water and eating well.  Did discuss with her she could try taking a Benadryl at night or melatonin to see if that would help her to sleep.  Patient and her mother expressed understanding.  I instructed the patient to call us  if she has any other questions or concerns about anything.

## 2023-06-08 NOTE — Telephone Encounter (Signed)
 Agree with sending her to urgent care-- she just recently had surgery and they need to make sure she is not having any postoperative complications. She has a provider who is writing her psychiatric medications and she can also contact their office for advice.

## 2023-06-10 NOTE — Telephone Encounter (Signed)
 Patient informed of the message below and stated she received instructions from the psychiatrist.

## 2023-06-24 ENCOUNTER — Ambulatory Visit (INDEPENDENT_AMBULATORY_CARE_PROVIDER_SITE_OTHER): Payer: BC Managed Care – PPO | Admitting: Surgical

## 2023-06-24 DIAGNOSIS — Z9889 Other specified postprocedural states: Secondary | ICD-10-CM

## 2023-06-24 DIAGNOSIS — N62 Hypertrophy of breast: Secondary | ICD-10-CM

## 2023-06-24 NOTE — Progress Notes (Signed)
 Patient is a 18 y.o.-year-old female status post bilateral breast reduction with Dr.  Carolynne Citron. Patient is 4 weeks postop.  She is here with her mother today.  She reports she is overall doing well, however has noticed wounds of bilateral breast.  She is not having any infectious symptoms.  Chaperone present on exam Bilateral NAC's are viable, she does have wounds of bilateral breasts, left T-junction just below the NAC and right T-junction along the inframammary fold.  There is no surrounding erythema or cellulitic changes.  She does have some irritation present, but not concerning for infection. There is no cellulitic changes noted to either breast. No obvious subcutaneous fluid collections noted with palpation.   A/P:  Recommend continuing with compressive garment 24/7 until 6 weeks post-op,  avoiding strenuous activity/heavy lifting until 6 weeks post-op. Recommend avoiding submerging incisions in water.  Recommend following up in 2 weeks for reevaluation.  All of the patient's questions were answered to their content. Recommend calling with any questions or concerns.  Recommend Vaseline and gauze to bilateral breast wounds.  Pictures were obtained of the patient and placed in the chart with the patient's or guardian's permission.

## 2023-06-25 ENCOUNTER — Other Ambulatory Visit: Payer: Self-pay | Admitting: Family Medicine

## 2023-06-25 DIAGNOSIS — J4541 Moderate persistent asthma with (acute) exacerbation: Secondary | ICD-10-CM

## 2023-07-09 NOTE — Progress Notes (Unsigned)
 Patient is a 18 year old female here for follow-up after bilateral breast reduction with Dr. Carolynne Citron on 05/28/2023.  She does have some small wounds of bilateral breast, right inferior T-junction and left superior T-junction.

## 2023-07-10 ENCOUNTER — Encounter: Payer: Self-pay | Admitting: Surgical

## 2023-07-10 ENCOUNTER — Ambulatory Visit (INDEPENDENT_AMBULATORY_CARE_PROVIDER_SITE_OTHER): Admitting: Surgical

## 2023-07-10 VITALS — BP 110/66 | HR 110 | Ht 64.5 in | Wt 156.0 lb

## 2023-07-10 DIAGNOSIS — Z9889 Other specified postprocedural states: Secondary | ICD-10-CM

## 2023-07-23 NOTE — Progress Notes (Unsigned)
 Patient is a 18 y.o.-year-old female status post bilateral breast reduction with Dr.  Carolynne Citron. Patient is 8 weeks postop.  She reports she is doing really well, she is continuing to do Vaseline to right breast wound.    Chaperone present on exam Bilateral NAC's are viable. Left breast incisions are intact and are healed.  She does have some areas of scabbing still present.  Right breast incisions mostly healed, she does have a wound still present at the T-junction inferiorly and a new hypergranulating wound along the lateral aspect just adjacent to the vertical limb.   There is no erythema or cellulitic changes noted. No obvious subcutaneous fluid collections noted with palpation.   A/P:  Continue with Vaseline and gauze to right breast wound.  We did use silver nitrate on right lateral breast hypergranulation tissue to allow epithelialization over the hypergranulating area.  Recommend continue to avoid submerging incisions in hot tub pool or bathtub.  Recommend following up in 2 weeks for reevaluation.  All of the patient's questions were answered to their content. Recommend calling with any questions or concerns.  Pictures were obtained of the patient and placed in the chart with the patient's or guardian's permission.

## 2023-07-24 ENCOUNTER — Ambulatory Visit: Admitting: Surgical

## 2023-07-24 VITALS — BP 103/71 | HR 95

## 2023-07-24 DIAGNOSIS — Z9889 Other specified postprocedural states: Secondary | ICD-10-CM

## 2023-07-29 DIAGNOSIS — F331 Major depressive disorder, recurrent, moderate: Secondary | ICD-10-CM | POA: Diagnosis not present

## 2023-07-29 DIAGNOSIS — F509 Eating disorder, unspecified: Secondary | ICD-10-CM | POA: Diagnosis not present

## 2023-07-29 DIAGNOSIS — F419 Anxiety disorder, unspecified: Secondary | ICD-10-CM | POA: Diagnosis not present

## 2023-07-29 DIAGNOSIS — F900 Attention-deficit hyperactivity disorder, predominantly inattentive type: Secondary | ICD-10-CM | POA: Diagnosis not present

## 2023-08-06 NOTE — Progress Notes (Unsigned)
 Patient is a 18 y.o.-year-old female status post bilateral breast reduction with Dr.  Waddell. Patient is 10 weeks postop.  She did have some postoperative wounds which we have been managing.  At her last appointment we did use silver nitrate to the right lateral breast hypergranulation tissue.  Chaperone present on exam Bilateral NAC's are viable, bilateral breast incisions are all healed at this time. There is no erythema or cellulitic changes noted. No obvious subcutaneous fluid collections noted with palpation.   A/P:  Discussed scar creams and scar strips  Pictures were obtained of the patient and placed in the chart with the patient's or guardian's permission.  Recommend following up as needed.  All of the patient's questions were answered to their content. Recommend calling with any questions or concerns.

## 2023-08-07 ENCOUNTER — Ambulatory Visit: Admitting: Surgical

## 2023-08-07 ENCOUNTER — Encounter: Payer: Self-pay | Admitting: Surgical

## 2023-08-07 VITALS — BP 100/70 | HR 111

## 2023-08-07 DIAGNOSIS — N62 Hypertrophy of breast: Secondary | ICD-10-CM

## 2023-08-07 DIAGNOSIS — Z9889 Other specified postprocedural states: Secondary | ICD-10-CM

## 2023-08-17 ENCOUNTER — Other Ambulatory Visit: Payer: Self-pay | Admitting: Obstetrics and Gynecology

## 2023-08-17 DIAGNOSIS — N946 Dysmenorrhea, unspecified: Secondary | ICD-10-CM

## 2023-08-17 DIAGNOSIS — G8929 Other chronic pain: Secondary | ICD-10-CM

## 2023-08-19 ENCOUNTER — Encounter: Payer: Self-pay | Admitting: Obstetrics and Gynecology

## 2023-08-19 ENCOUNTER — Other Ambulatory Visit: Payer: Self-pay | Admitting: Lactation Services

## 2023-08-19 DIAGNOSIS — G8929 Other chronic pain: Secondary | ICD-10-CM

## 2023-08-19 DIAGNOSIS — N946 Dysmenorrhea, unspecified: Secondary | ICD-10-CM

## 2023-08-19 MED ORDER — NORETHIN ACE-ETH ESTRAD-FE 1.5-30 MG-MCG PO TABS
1.0000 | ORAL_TABLET | Freq: Every day | ORAL | 0 refills | Status: DC
Start: 2023-08-19 — End: 2023-09-25

## 2023-08-19 NOTE — Progress Notes (Signed)
 3 month supply of Junel ordered per standing order. Last seen in early 2024. Messaged patient that she need to make a follow up annual appt for further refills.

## 2023-09-17 ENCOUNTER — Ambulatory Visit: Admitting: Family Medicine

## 2023-09-17 ENCOUNTER — Encounter: Payer: Self-pay | Admitting: Family Medicine

## 2023-09-17 VITALS — BP 118/80 | HR 120 | Temp 98.3°F | Ht 64.5 in | Wt 147.0 lb

## 2023-09-17 DIAGNOSIS — F5104 Psychophysiologic insomnia: Secondary | ICD-10-CM

## 2023-09-17 DIAGNOSIS — E559 Vitamin D deficiency, unspecified: Secondary | ICD-10-CM | POA: Diagnosis not present

## 2023-09-17 DIAGNOSIS — D508 Other iron deficiency anemias: Secondary | ICD-10-CM

## 2023-09-17 MED ORDER — TRAZODONE HCL 50 MG PO TABS: 25.0000 mg | ORAL_TABLET | Freq: Every evening | ORAL | 3 refills | Status: DC | PRN

## 2023-09-17 NOTE — Progress Notes (Unsigned)
 Established Patient Office Visit  Subjective   Patient ID: Ann Vaughan, female    DOB: [birth year]  Age: 18 y.o. MRN: [id number]  Chief Complaint  Patient presents with   Insomnia    Since age 19, worse for the past few months and states she has not been able to sleep at all, noticed after stopping Methylphenidate after breast reduction in April and again in July, tried Hydroxyzine  with some relief    Patient is reporting worsening difficulty with sleep. States that since last summer she has had two episodes one in April and one about a month ago. States that the one last month might have been brought on by trying to take vallerian root to try and help her sleep. States that she is worried about her sleep patterns and it is very concerning. States that it takes about 1-2 hours for her to fall asleep, states she has all of the sleep hygiene things -- cool, dark room, has tried a fan in the past but it made it worse -- has sound sensitivity. States that she has done everything that she can to help but it is not working. Has noticed a random numbness in her arms and legs, states that she hasn't had labs    Insomnia    Current Outpatient Medications  Medication Instructions   albuterol  (PROVENTIL ) (2.5 MG/3ML) 0.083% nebulizer solution Inhale 3 mLs (1 vial) by nebulization every 6 (six) hours as needed. For shortness of breath   albuterol  (VENTOLIN  HFA) 108 (90 Base) MCG/ACT inhaler 2 puffs, Inhalation, Every 4 hours PRN   beclomethasone (QVAR  REDIHALER) 40 MCG/ACT inhaler 2 puffs, Inhalation, 2 times daily   cholecalciferol (VITAMIN D3) 2,000 Units, Every morning   hydrOXYzine  (ATARAX ) 25-50 mg, Daily at bedtime   lamoTRIgine (LAMICTAL) 150 mg, 2 times daily   lithium  carbonate 150 mg, Daily at bedtime   norethindrone -ethinyl estradiol-iron (JUNEL FE 1.5/30) 1.5-30 MG-MCG tablet 1 tablet, Oral, Daily   ondansetron  (ZOFRAN ) 4 mg, Oral, Every 8 hours PRN   polyethylene glycol (MIRALAX  /  GLYCOLAX ) 17 g, Oral, Daily PRN   Qelbree 300 mg, Daily at bedtime   traZODone  (DESYREL ) 25-50 mg, Oral, At bedtime PRN    Patient Active Problem List   Diagnosis Date Noted   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   All other systems reviewed and are negative.     Objective:     BP 118/80   Pulse (!) 120   Temp 98.3 F (36.8 C) (Oral)   Ht 5' 4.5 (1.638 m)   Wt 147 lb (66.7 kg)   SpO2 97%   BMI 24.84 kg/m  {Vitals History (Optional):23777}  Physical Exam Vitals reviewed.  Constitutional:      Appearance: Normal appearance. She is normal weight.  Pulmonary:     Effort: Pulmonary effort is normal.  Neurological:     Mental Status: She is alert.      No results found for any visits on 09/17/23.  {Labs (Optional):23779}  The ASCVD Risk score (Arnett DK, et al., 2019) failed to calculate for the following reasons:  The 2019 ASCVD risk score is only valid for ages 43 to 77    Assessment & Plan:  Iron deficiency anemia secondary to inadequate dietary iron intake -     Iron, TIBC and Ferritin Panel; Future  Chronic insomnia -     TSH; Future -     Vitamin B12; Future -     traZODone  HCl; Take 0.5-1 tablets (25-50 mg total) by mouth at bedtime as needed for sleep.  Dispense: 30 tablet; Refill: 3  Vitamin D  deficiency -     VITAMIN D  25 Hydroxy (Vit-D Deficiency, Fractures); Future     No follow-ups on file.    Heron CHRISTELLA Sharper, MD

## 2023-09-25 ENCOUNTER — Other Ambulatory Visit: Payer: Self-pay

## 2023-09-25 ENCOUNTER — Ambulatory Visit: Admitting: Obstetrics and Gynecology

## 2023-09-25 ENCOUNTER — Encounter: Payer: Self-pay | Admitting: Obstetrics and Gynecology

## 2023-09-25 DIAGNOSIS — Z1331 Encounter for screening for depression: Secondary | ICD-10-CM | POA: Diagnosis not present

## 2023-09-25 DIAGNOSIS — N946 Dysmenorrhea, unspecified: Secondary | ICD-10-CM

## 2023-09-25 DIAGNOSIS — R102 Pelvic and perineal pain: Secondary | ICD-10-CM

## 2023-09-25 DIAGNOSIS — G8929 Other chronic pain: Secondary | ICD-10-CM | POA: Diagnosis not present

## 2023-09-25 MED ORDER — NORETHIN ACE-ETH ESTRAD-FE 1.5-30 MG-MCG PO TABS: 1.0000 | ORAL_TABLET | Freq: Every day | ORAL | 4 refills | Status: AC

## 2023-09-25 MED ORDER — CYCLOBENZAPRINE HCL 10 MG PO TABS: 10.0000 mg | ORAL_TABLET | Freq: Two times a day (BID) | ORAL | 1 refills | Status: AC | PRN

## 2023-09-25 NOTE — Progress Notes (Signed)
 "   GYNECOLOGY VISIT  Patient name: Ann Vaughan MRN [id number]  Date of birth: 2005-02-16 Chief Complaint:   Follow-up   History:  Ann Vaughan is a 18 y.o. G0P0000 being seen today for dysmenorrhea follow up. Will miss the prescription for a day or 2 and the pain will crank back up. Will have pain with full bladder or orgasm 2-3/10. Continous use of OCP and amenorrhea. Bled heavy for 5 days when she last missed a pill in July.   Discussed the use of AI scribe software for clinical note transcription with the patient, who gave verbal consent to proceed.  History of Present Illness Ann Vaughan is a 18 year old female with endometriosis who presents with increased pelvic pain and breakthrough bleeding.  After her surgery, she experienced six months without pain or bleeding. However, she began experiencing mild pain once a month or every couple of months, which was about 40-50% of the intensity compared to pre-surgery. Since the beginning of the year, the intensity of the breakthrough pain has increased, although the frequency has not changed.  In May and July, she had difficulty refilling her birth control, leading to a couple of days of bleeding and pain. The pain is now at about 80% of its previous intensity. She experiences dull, low-level abdominal pain after having a full bladder or after orgasm, described as a 'dull like a pulling pain' in her abdomen, rated as a 2 or 3 on the pain scale. This has led to less frequent masturbation.  She has been on hormonal birth control, which she takes daily, skipping the placebo pills. She attributes the control of her symptoms largely to this medication, noting that when she misses doses due to refill issues, her pain worsens significantly. She uses ibuprofen , Tylenol , and a heating pad for pain management, but finds them largely ineffective.  She previously attended pelvic physical therapy for two months and found some benefit, but has not been  dilating much recently due to increased pain with urination. She continues to use techniques learned in therapy, such as elevating her hips and massaging her abdomen to relieve tension.  She experiences pain when she has a full bladder, which is alleviated after urination. This pain is not associated with any specific dietary triggers. She is not sexually active but experiences pain after orgasm.   Past Medical History:  Diagnosis Date   Allergies    Anxiety 2020   Asthma    Asthma    Constipation    12/20/2021   Depression    Eating disorder    Endometriosis 2019   GERD (gastroesophageal reflux disease)    OCD (obsessive compulsive disorder)     Past Surgical History:  Procedure Laterality Date   BREAST REDUCTION SURGERY Bilateral 05/28/2023   Procedure: bilateral breast reduction;  Surgeon: Waddell Leonce NOVAK, MD;  Location: [facility];  Service: Plastics;  Laterality: Bilateral;   CYSTOSCOPY N/A 04/24/2022   Procedure: CYSTOSCOPY;  Surgeon: Jeralyn Crutch, MD;  Location: [facility] OR;  Service: Gynecology;  Laterality: N/A;   LAPAROSCOPY N/A 04/24/2022   Procedure: LAPAROSCOPY DIAGNOSTIC AND POSSIBLE EXCISION OF ENDOMETRIOSIS;  Surgeon: Jeralyn Crutch, MD;  Location: [facility];  Service: Gynecology;  Laterality: N/A;  Pull instruments GYN Cysto Set  Cysto Tubing and addtional [employer] Camera   MYRINGOTOMY      The following portions of the patient's history were reviewed and updated as appropriate: allergies, current medications, past family history, past medical history,  past social history, past surgical history and problem list.   Health Maintenance:   Last pap n/a Last mammogram: n/a   Review of Systems:  Pertinent items are noted in HPI. Comprehensive review of systems was otherwise negative.   Objective:  Physical Exam BP 137/85   Pulse (!) 132   Wt 146 lb 14.4 oz (66.6 kg)   LMP 08/21/2023 (Approximate)   BMI 24.83 kg/m    Physical Exam Vitals and  nursing note reviewed.  Constitutional:      Appearance: Normal appearance.  HENT:     Head: Normocephalic and atraumatic.  Pulmonary:     Effort: Pulmonary effort is normal.  Skin:    General: Skin is warm and dry.  Neurological:     General: No focal deficit present.     Mental Status: She is alert.  Psychiatric:        Mood and Affect: Mood normal.        Behavior: Behavior normal.        Thought Content: Thought content normal.        Judgment: Judgment normal.     Abdominal Trigger Point Procedure Patient identified, informed consent performed, consent signed. The abdominal wall was examined and 11 points were identified and marked. Each site was cleaned with alcohol wipes. Each site was sprayed with hurricane spray and the needle introduced into the abdominal wall. Once the trigger point was confirmed to be entered by patient report of pain, 1cc of 1% lidocaine  was injected and then pressure held at the injection site. The patient tolerated the procedure well and was given post procedure instructions.       Assessment & Plan:   Assessment & Plan Endometriosis with chronic pelvic pain Chronic pelvic pain due to endometriosis, worsened to 80% of pre-surgery levels. Pain linked to pelvic floor spasms, exacerbated by full bladder and orgasm. Current hormonal birth control effective when taken consistently. Insufficient pain control with current regimen. Trigger points in abdomen suggest benefit from injections. - Refill birth control prescription for continuous use, skipping placebo pills. - now s/p trigger point injections for abdominal pain. - Prescribe Flexeril  for pelvic floor spasms. - Encourage pelvic floor exercises. - Apply heat for pain relief around the umbilicus.   Carter Quarry, MD [facility], [facility] "

## 2023-10-06 ENCOUNTER — Other Ambulatory Visit: Payer: Self-pay | Admitting: Family Medicine

## 2023-10-06 DIAGNOSIS — J4541 Moderate persistent asthma with (acute) exacerbation: Secondary | ICD-10-CM

## 2023-10-09 ENCOUNTER — Other Ambulatory Visit: Payer: Self-pay | Admitting: Family Medicine

## 2023-10-09 DIAGNOSIS — F5104 Psychophysiologic insomnia: Secondary | ICD-10-CM

## 2023-10-17 ENCOUNTER — Ambulatory Visit: Payer: Self-pay

## 2023-10-17 NOTE — Telephone Encounter (Signed)
 Attempted to contact pt 3x using pt phone number with 9+ and 91+, all resulted in Call could not be completed as dialed. Also called pt mother and received Call could not be completed as dialed x1. Nurse restarting.    Message from [place] F sent at 10/17/2023  1:57 PM EDT  Since starting traZODone  (DESYREL ) 50 MG tablet she has been experiencing constapation. The only way she can make a bowel movement she has to take miralax . She stopped the medication for about a week and constipation is still there. She states she is very uncomfortable and has a lot of stomach pain  Please call pt at [phone]

## 2023-10-17 NOTE — Telephone Encounter (Signed)
 Reason for Disposition . [1] Uses laxative (e.g., PEG / Miralax , Milk of Magnesia) or enema AND [2] more than once a month . Insomnia is a chronic symptom (recurrent or ongoing AND present > 4 weeks)  Answer Assessment - Initial Assessment Questions 1. STOOL PATTERN OR FREQUENCY: How often do you have a bowel movement (BM)?  (Normal range: 3 times a day to every 3 days)  When was your last BM?       Before taking trazadone, patient would have one or two BM's per day 2. STRAINING: Do you have to strain to have a BM?      yes 3. ONSET: When did the constipation begin?     About a month ago 4. RECTAL PAIN: Does your rectum hurt when the stool comes out? If Yes, ask: Do you have hemorrhoids? How bad is the pain?  (Scale 1-10; or mild, moderate, severe)     denies 5. BM COMPOSITION: Are the stools hard?      Soft- using miralax  6. BLOOD ON STOOLS: Has there been any blood on the toilet tissue or on the surface of the BM? If Yes, ask: When was the last time?     denies 7. CHRONIC CONSTIPATION: Is this a new problem for you?  If No, ask: How long have you had this problem? (days, weeks, months)      No chronic constipation, only with the use of trazadone 8. CHANGES IN DIET OR HYDRATION: Have there been any recent changes in your diet? How much fluids are you drinking on a daily basis?  How much have you had to drink today?     denies 9. MEDICINES: Have you been taking any new medicines? Are you taking any narcotic pain medicines? (e.g., Dilaudid , morphine , Percocet, Vicodin)     trazadone 10. LAXATIVES: Have you been using any stool softeners, laxatives, or enemas?  If Yes, ask What are you using, how often, and when was the last time?       Miralax , daily at noon 11. ACTIVITY:  How much walking do you do every day?  Has your activity level decreased in the past week?        Moderately active 12. CAUSE: What do you think is causing the constipation?         Trazadone, patient began having constipation after starting trazadone. She has had no other changes 13. MEDICAL HISTORY: Do you have a history of hemorrhoids, rectal fissures, rectal surgery, or rectal abscess?         N/a 14. OTHER SYMPTOMS: Do you have any other symptoms? (e.g., abdomen pain, bloating, fever, vomiting)       Abdominal pain 15. PREGNANCY: Is there any chance you are pregnant? When was your last menstrual period?       Endometriosis, irregular periods  Answer Assessment - Initial Assessment Questions 1. DESCRIPTION: Tell me about your sleeping problem. (e.g., waking frequently during night, sleeping during day and awake at night, trouble falling asleep) How bad is it?      Difficulty falling asleep 2. ONSET: How long have you been having trouble sleeping? (e.g., days, weeks, months; longstanding sleep problems)     Life time Patient was prescribed trazadone one month ago which helped her sleep, but caused constipation.  She has stopped taking trazadone  Protocols used: Constipation-A-AH, Insomnia-A-AH

## 2023-10-17 NOTE — Telephone Encounter (Signed)
 FYI Only or Action Required?: Action required by provider: clinical question for provider and update on patient condition.  Patient was last seen in primary care on 09/17/2023 by Ozell Heron HERO, MD.  Called Nurse Triage reporting Constipation.  Symptoms began about a month ago.  Interventions attempted: OTC medications: miralax .  Symptoms are: gradually improving.  Triage Disposition: See PCP Within 2 Weeks  Patient/caregiver understands and will follow disposition?: Yes

## 2023-10-17 NOTE — Telephone Encounter (Signed)
 Attempted to contact pt number, Call could not be completed as dialed x1 after nurse restarted system. Attempted to contact pt father, no answer, LVM for pt to call back to PCP office.

## 2023-10-17 NOTE — Telephone Encounter (Signed)
 Please see alternate NT encounter.     Copied from CRM #[id number]. Topic: Clinical - Pink Word Triage >> Oct 17, 2023  1:56 PM Ann Vaughan wrote: Reason for Triage:Since starting traZODone  (DESYREL ) 50 MG tablet she has been experiencing constapation. The only way she can make a bowel movement she has to take miralax . She stopped the medication for about a week and constipation is still there. She states she is very uncomfortable and has a lot of stomach pain >> Oct 17, 2023  4:02 PM Ann Vaughan wrote: Patient Ann Vaughan stated that she was informed by her father that someone had been calling for her, but instead of calling her number they reached her father 2 xs in regards to the constipation she had called about this morning. Patient is currently on the line returning the call  >> Oct 17, 2023  1:57 PM Ann Vaughan wrote: Since starting traZODone  (DESYREL ) 50 MG tablet she has been experiencing constapation. The only way she can make a bowel movement she has to take miralax . She stopped the medication for about a week and constipation is still there. She states she is very uncomfortable and has a lot of stomach pain   Please call pt at [phone] This encounter was created in error - please disregard.

## 2023-10-17 NOTE — Telephone Encounter (Signed)
 If you can reach the patient have her stop the trazodone  permanently, she may also use dulcolax OTC or milk of magnesia-- she might need to take this medication in order to relieve the constipation pain-- have several BM's in a row

## 2023-10-17 NOTE — Telephone Encounter (Signed)
 Attempted to contact father, no answer, LVM for pt to call back to PCP office. Routing to PCP for further follow up.

## 2023-10-17 NOTE — Telephone Encounter (Signed)
 Patient informed of the message below.

## 2023-10-22 ENCOUNTER — Encounter: Payer: Self-pay | Admitting: Family Medicine

## 2023-10-22 ENCOUNTER — Ambulatory Visit: Admitting: Family Medicine

## 2023-10-22 VITALS — BP 102/68 | HR 100 | Temp 98.2°F | Ht 64.5 in | Wt 144.9 lb

## 2023-10-22 DIAGNOSIS — K5903 Drug induced constipation: Secondary | ICD-10-CM

## 2023-10-22 NOTE — Patient Instructions (Addendum)
 Magnesium glycinate -- 200 mg to 400 mg daily at bedtime. Add this to the 1/2 tablet of the trazodone  at night, take 30-45 minutes before bed.

## 2023-10-22 NOTE — Progress Notes (Signed)
 Acute Office Visit  Subjective:     Patient ID: Ann Vaughan, female    DOB: 06-Feb-2006, 18 y.o.   MRN: [id number]  Chief Complaint  Patient presents with   Constipation    X1 month,taking Miralax  feeling somewhat better and had a bowel movement today    Constipation This is a new problem. The current episode started 1 to 4 weeks ago. The problem has been gradually improving since onset. Her stool frequency is 2 to 3 times per week. The patient is on a high fiber diet. She Does not exercise regularly. There has Been adequate water intake. Pertinent negatives include no diarrhea, nausea or vomiting. Risk factors include change in medication usage/dosage. She has tried diet changes, fiber and stool softeners for the symptoms. The treatment provided mild relief.  Discussed the use of AI scribe software for clinical note transcription with the patient, who gave verbal consent to proceed.  History of Present Illness   Ann Vaughan is a 18 year old female who presents with sleep disturbances and constipation after starting trazodone .  She started trazodone  approximately one month ago to aid with sleep. Initially, she experienced improved sleep, falling asleep within an hour after taking the medication. She subsequently developed constipation after starting trazodone , and she did not recall any other new medications or dietary changes at that time.  She discontinued trazodone  a few weeks ago due to constipation and has been using Miralax  to manage her bowel movements. She last took Miralax  two days ago and had a bowel movement this morning. Her stools are soft, not hard or rock-like, when she is able to have a bowel movement. She prefers not to use Dulcolax due to past painful experiences. She has increased her vegetable intake and committed to a vegan diet several months ago, which she believes may also be contributing to her bowel regularity. She has been mostly regular for the past two years,  following a negative experience with Trulance , which was prescribed by her gastroenterologist for nausea.  Regarding her sleep, she has tried both half and full tablets of trazodone , finding the full tablet more effective for staying asleep. She has also used hydroxyzine  and over-the-counter melatonin in the past, but finds that they lose effectiveness if used consecutively for several days. She reserves hydroxyzine  for particularly difficult nights.  Family history reveals that her mother has irritable bowel syndrome, which has been a concern for her given her own gastrointestinal symptoms. She has experienced stomach issues intermittently but not as consistently as her mother.       Review of Systems  Gastrointestinal:  Positive for constipation. Negative for diarrhea, nausea and vomiting.  All other systems reviewed and are negative.       Objective:    BP 102/68   Pulse 100   Temp 98.2 F (36.8 C) (Oral)   Ht 5' 4.5 (1.638 m)   Wt 144 lb 14.4 oz (65.7 kg)   LMP 08/21/2023 (Approximate)   SpO2 98%   BMI 24.49 kg/m    Physical Exam Vitals reviewed.  Constitutional:      Appearance: Normal appearance. She is well-groomed and normal weight.  Cardiovascular:     Rate and Rhythm: Normal rate and regular rhythm.     Heart sounds: S1 normal and S2 normal.  Pulmonary:     Effort: Pulmonary effort is normal.     Breath sounds: Normal breath sounds and air entry.  Abdominal:     General: Bowel sounds  are normal. There is no distension.  Neurological:     Mental Status: She is alert and oriented to person, place, and time. Mental status is at baseline.     Gait: Gait is intact.  Psychiatric:        Mood and Affect: Mood and affect normal.        Speech: Speech normal.        Behavior: Behavior normal.        Judgment: Judgment normal.     No results found for any visits on 10/22/23.      Assessment & Plan:   Problem List Items Addressed This Visit   None Visit  Diagnoses       Drug-induced constipation    -  Primary     Assessment and Plan    Insomnia Chronic insomnia managed with trazodone , previously effective but discontinued due to constipation. - Reintroduce trazodone  at half a tablet at bedtime. - Add magnesium glycinate 200-400 mg at bedtime to aid sleep and prevent constipation. - Continue hydroxyzine  as an occasional sleep aid if needed. - Monitor sleep patterns and effectiveness over two weeks.  Constipation Constipation likely induced by trazodone , improved upon discontinuation. Managed with Miralax , effective for long-term use. - Add magnesium glycinate 200-400 mg at bedtime to aid bowel movements. - Use Miralax  as needed if bowel movements are not regular. - Avoid stimulant laxatives like Dulcolax and Trulance .  Functional bowel symptoms Functional bowel symptoms with family history of IBS. Symptoms include constipation and occasional nausea. Potential for IBS, not definitively diagnosed. - Continue dietary modifications with increased fiber intake. - Monitor for changes in bowel habits or symptoms suggesting IBS. - Consider further evaluation if symptoms persist or worsen.        No orders of the defined types were placed in this encounter.   No follow-ups on file.  Heron CHRISTELLA Sharper, MD

## 2023-10-25 ENCOUNTER — Other Ambulatory Visit (INDEPENDENT_AMBULATORY_CARE_PROVIDER_SITE_OTHER)

## 2023-10-25 DIAGNOSIS — E559 Vitamin D deficiency, unspecified: Secondary | ICD-10-CM | POA: Diagnosis not present

## 2023-10-25 DIAGNOSIS — D508 Other iron deficiency anemias: Secondary | ICD-10-CM

## 2023-10-25 DIAGNOSIS — F5104 Psychophysiologic insomnia: Secondary | ICD-10-CM

## 2023-10-25 LAB — VITAMIN B12: Vitamin B-12: 304 pg/mL (ref 211–911)

## 2023-10-25 LAB — VITAMIN D 25 HYDROXY (VIT D DEFICIENCY, FRACTURES): VITD: 91.25 ng/mL (ref 30.00–100.00)

## 2023-10-25 LAB — TSH: TSH: 2.16 u[IU]/mL (ref 0.35–5.50)

## 2023-10-26 LAB — IRON,TIBC AND FERRITIN PANEL
%SAT: 10 % — ABNORMAL LOW (ref 16–45)
Ferritin: 7 ng/mL — ABNORMAL LOW (ref 16–154)
Iron: 51 ug/dL (ref 40–190)
TIBC: 536 ug/dL — ABNORMAL HIGH (ref 250–450)

## 2023-10-29 ENCOUNTER — Ambulatory Visit: Payer: Self-pay | Admitting: Family Medicine

## 2023-11-15 ENCOUNTER — Ambulatory Visit: Admitting: Adult Health

## 2023-11-15 ENCOUNTER — Ambulatory Visit: Payer: Self-pay

## 2023-11-15 VITALS — BP 110/60 | HR 119 | Temp 98.4°F | Ht 64.5 in | Wt 140.0 lb

## 2023-11-15 DIAGNOSIS — J45901 Unspecified asthma with (acute) exacerbation: Secondary | ICD-10-CM | POA: Diagnosis not present

## 2023-11-15 MED ORDER — PREDNISONE 10 MG PO TABS: ORAL_TABLET | ORAL | 0 refills | Status: AC

## 2023-11-15 MED ORDER — BENZONATATE 200 MG PO CAPS: 200.0000 mg | ORAL_CAPSULE | Freq: Three times a day (TID) | ORAL | 0 refills | Status: AC | PRN

## 2023-11-15 NOTE — Telephone Encounter (Signed)
 FYI Only or Action Required?: FYI only for provider.  Patient was last seen in primary care on 10/22/2023 by Ozell Heron HERO, MD.  Called Nurse Triage reporting Cough.  Symptoms began several days ago.  Interventions attempted: Rest, hydration, or home remedies.  Symptoms are: gradually worsening.  Triage Disposition: See Physician Within 24 Hours  Patient/caregiver understands and will follow disposition?: Yes  Copied from CRM #[id number]. Topic: Clinical - Red Word Triage >> Nov 15, 2023 10:07 AM Harlene ORN wrote: Red Word that prompted transfer to Nurse Triage: persistent cough and wheezing for 8 days. Has trouble breathing in between coughs. Has gotten worse within the 8 days. Reason for Disposition  [1] Continuous (nonstop) coughing interferes with work or school AND [2] no improvement using cough treatment per Care Advice  Answer Assessment - Initial Assessment Questions Pt had cold sxs 8 days ago, states the cough has gotten progressively worse and interferes with work/school. Denies fevers/CP. Admits to a slight wheeze but only after coughing fit  1. ONSET: When did the cough begin?      8 days ago 2. SEVERITY: How bad is the cough today?      continual 3. SPUTUM: Describe the color of your sputum (e.g., none, dry cough; clear, white, yellow, green)     clear 4. HEMOPTYSIS: Are you coughing up any blood? If Yes, ask: How much? (e.g., flecks, streaks, tablespoons, etc.)     denies 5. DIFFICULTY BREATHING: Are you having difficulty breathing? If Yes, ask: How bad is it? (e.g., mild, moderate, severe)      After a coughing fit 6. FEVER: Do you have a fever? If Yes, ask: What is your temperature, how was it measured, and when did it start?     denies 7. CARDIAC HISTORY: Do you have any history of heart disease? (e.g., heart attack, congestive heart failure)      denies 8. LUNG HISTORY: Do you have any history of lung disease?  (e.g., pulmonary embolus,  asthma, emphysema)     asthma 9. PE RISK FACTORS: Do you have a history of blood clots? (or: recent major surgery, recent prolonged travel, bedridden)     denies 10. OTHER SYMPTOMS: Do you have any other symptoms? (e.g., runny nose, wheezing, chest pain)       Wheezing but after the coughing fits needs to catch breath 11. PREGNANCY: Is there any chance you are pregnant? When was your last menstrual period?       Denies; LMP July 12. TRAVEL: Have you traveled out of the country in the last month? (e.g., travel history, exposures)       denies  Protocols used: Cough - Acute Productive-A-AH

## 2023-11-15 NOTE — Telephone Encounter (Signed)
 Noted- ok to close.

## 2023-11-15 NOTE — Progress Notes (Signed)
 Subjective:    Patient ID: Ann Vaughan, female    DOB: [birth year], 18 y.o.   MRN: [id number]  HPI 18 year old female who  has a past medical history of Allergies, Anxiety (2020), Asthma, Asthma, Constipation, Depression, Eating disorder, Endometriosis (2019), GERD (gastroesophageal reflux disease), and OCD (obsessive compulsive disorder).  She presents to the office today for an acute visit. She reports that for the last week she has had a productive cough x 1 week. Production is clear. When she is coughing it is hard for her to breath and she has noticed some wheezing when coughing. He has been using her inhalers and this helps her feel like she can breath easier but has not helped with the cough. She has been having to use her inhalers more during the fall.   She has not had any fevers, chills, sinus pain or pressure.   She has been using cough drops and Vicks Vapo Rub at home with temporarily improvement   Review of Systems See HPI   Past Medical History:  Diagnosis Date   Allergies    Anxiety 2020   Asthma    Asthma    Constipation    12/20/2021   Depression    Eating disorder    Endometriosis 2019   GERD (gastroesophageal reflux disease)    OCD (obsessive compulsive disorder)     Social History   Socioeconomic History   Marital status: Single    Spouse name: Not on file   Number of children: 0   Years of education: Not on file   Highest education level: Some college, no degree  Occupational History   Occupation: retail associated  Tobacco Use   Smoking status: Never    Passive exposure: Yes   Smokeless tobacco: Never  Vaping Use   Vaping status: Never Used  Substance and Sexual Activity   Alcohol use: Not Currently   Drug use: Never   Sexual activity: Never    Birth control/protection: Abstinence, Pill  Other Topics Concern   Not on file  Social History Narrative   Not on file   Social Drivers of Health   Financial Resource Strain: Patient Declined  (09/10/2023)   Overall Financial Resource Strain (CARDIA)    Difficulty of Paying Living Expenses: Patient declined  Food Insecurity: No Food Insecurity (09/25/2023)   Hunger Vital Sign    Worried About Running Out of Food in the Last Year: Never true    Ran Out of Food in the Last Year: Never true  Transportation Needs: No Transportation Needs (09/25/2023)   PRAPARE - Administrator, Civil Service (Medical): No    Lack of Transportation (Non-Medical): No  Physical Activity: Insufficiently Active (09/10/2023)   Exercise Vital Sign    Days of Exercise per Week: 3 days    Minutes of Exercise per Session: 10 min  Stress: Stress Concern Present (09/10/2023)   [employer] of Occupational Health - Occupational Stress Questionnaire    Feeling of Stress: Very much  Social Connections: Socially Isolated (09/10/2023)   Social Connection and Isolation Panel    Frequency of Communication with Friends and Family: More than three times a week    Frequency of Social Gatherings with Friends and Family: Once a week    Attends Religious Services: Never    Database administrator or Organizations: No    Attends Engineer, structural: Not on file    Marital Status: Never married  Intimate Partner Violence:  Unknown (05/19/2021)   Received from [facility]   HITS    Physically Hurt: Not on file    Insult or Talk Down To: Not on file    Threaten Physical Harm: Not on file    Scream or Curse: Not on file    Past Surgical History:  Procedure Laterality Date   BREAST REDUCTION SURGERY Bilateral 05/28/2023   Procedure: bilateral breast reduction;  Surgeon: Waddell Leonce NOVAK, MD;  Location: [facility];  Service: Plastics;  Laterality: Bilateral;   CYSTOSCOPY N/A 04/24/2022   Procedure: CYSTOSCOPY;  Surgeon: Jeralyn Crutch, MD;  Location: [facility];  Service: Gynecology;  Laterality: N/A;   LAPAROSCOPY N/A 04/24/2022   Procedure: LAPAROSCOPY DIAGNOSTIC AND POSSIBLE EXCISION  OF ENDOMETRIOSIS;  Surgeon: Jeralyn Crutch, MD;  Location: [facility] OR;  Service: Gynecology;  Laterality: N/A;  Pull instruments GYN Cysto Set  Cysto Tubing and addtional Stryker Camera   MYRINGOTOMY      Family History  Problem Relation Age of Onset   Asthma Mother    Depression Mother    Irritable bowel syndrome Mother    Scleroderma Mother    Anxiety disorder Mother    Alcohol abuse Father    Asthma Brother    Diabetes Maternal Grandmother    Arthritis Maternal Grandmother    Depression Maternal Grandmother    Arthritis Maternal Grandfather    Depression Maternal Grandfather    Cancer Paternal Grandmother    Uterine cancer Paternal Grandmother    Arthritis Paternal Grandfather    Esophageal cancer Neg Hx    Liver disease Neg Hx     Allergies  Allergen Reactions   Other Shortness Of Breath    Environental/Asthma     Current Outpatient Medications on File Prior to Visit  Medication Sig Dispense Refill   albuterol  (PROVENTIL ) (2.5 MG/3ML) 0.083% nebulizer solution Inhale 3 mLs (1 vial) by nebulization every 6 (six) hours as needed. For shortness of breath 150 mL 2   albuterol  (VENTOLIN  HFA) 108 (90 Base) MCG/ACT inhaler Inhale 2 puffs into the lungs every 4 (four) hours as needed for wheezing or shortness of breath. 18 each 1   beclomethasone (QVAR  REDIHALER) 40 MCG/ACT inhaler Inhale 2 puffs into the lungs 2 (two) times daily. 1 each 3   cholecalciferol (VITAMIN D3) 25 MCG (1000 UT) tablet Take 2,000 Units by mouth in the morning.     cyclobenzaprine  (FLEXERIL ) 10 MG tablet Take 1 tablet (10 mg total) by mouth 2 (two) times daily as needed for muscle spasms. 30 tablet 1   hydrOXYzine  (ATARAX ) 25 MG tablet Take 25-50 mg by mouth at bedtime.     lamoTRIgine (LAMICTAL) 150 MG tablet Take 150 mg by mouth 2 (two) times daily.     lithium  carbonate 150 MG capsule Take 150 mg by mouth at bedtime.     norethindrone -ethinyl estradiol-iron (JUNEL FE 1.5/30) 1.5-30 MG-MCG tablet Take  1 tablet by mouth daily. 90 tablet 4   ondansetron  (ZOFRAN ) 4 MG tablet Take 1 tablet (4 mg total) by mouth every 8 (eight) hours as needed for nausea or vomiting. 20 tablet 0   polyethylene glycol (MIRALAX  / GLYCOLAX ) 17 g packet Take 17 g by mouth daily as needed. 14 each 0   QELBREE 150 MG 24 hr capsule Take 300 mg by mouth at bedtime.     No current facility-administered medications on file prior to visit.    BP 110/60   Pulse (!) 119   Temp 98.4 F (36.9  C) (Oral)   Ht 5' 4.5 (1.638 m)   Wt 140 lb (63.5 kg)   SpO2 98%   BMI 23.66 kg/m       Objective:   Physical Exam Vitals and nursing note reviewed.  Constitutional:      Appearance: Normal appearance.  HENT:     Mouth/Throat:     Mouth: Mucous membranes are moist.     Pharynx: Oropharynx is clear. No oropharyngeal exudate or posterior oropharyngeal erythema.  Cardiovascular:     Rate and Rhythm: Normal rate and regular rhythm.     Pulses: Normal pulses.  Pulmonary:     Effort: Pulmonary effort is normal.     Breath sounds: No stridor. Wheezing (trace throughout) present. No rhonchi.  Skin:    General: Skin is warm and dry.  Neurological:     General: No focal deficit present.     Mental Status: She is alert and oriented to person, place, and time.  Psychiatric:        Mood and Affect: Mood normal.        Behavior: Behavior normal.        Thought Content: Thought content normal.        Judgment: Judgment normal.       Assessment & Plan:  1. Mild asthma with acute exacerbation, unspecified whether persistent (Primary) - Will treat for acute asthma exacerbation. Follow up if not resolved by the time she is done with steroids. Follow up sooner if fever develops  - predniSONE  (DELTASONE ) 10 MG tablet; 40 mg x 3 days, 20 mg x 3 days, 10 mg x 3 days  Dispense: 21 tablet; Refill: 0 - benzonatate (TESSALON) 200 MG capsule; Take 1 capsule (200 mg total) by mouth 3 (three) times daily as needed for cough.  Dispense: 20  capsule; Refill: 0  Darleene Shape, NP  I personally spent a total of 30 minutes in the care of the patient today including preparing to see the patient, getting/reviewing separately obtained history, performing a medically appropriate exam/evaluation, counseling and educating, placing orders, and documenting clinical information in the EHR.

## 2023-11-21 ENCOUNTER — Ambulatory Visit (INDEPENDENT_AMBULATORY_CARE_PROVIDER_SITE_OTHER)
Admission: RE | Admit: 2023-11-21 | Discharge: 2023-11-21 | Disposition: A | Discharge status: Home / Self Care | Source: Ambulatory Visit | Attending: Adult Health | Admitting: Adult Health

## 2023-11-21 ENCOUNTER — Ambulatory Visit: Admitting: Adult Health

## 2023-11-21 ENCOUNTER — Encounter: Payer: Self-pay | Admitting: Adult Health

## 2023-11-21 VITALS — BP 128/62 | HR 98 | Temp 98.3°F | Ht 64.5 in | Wt 143.0 lb

## 2023-11-21 DIAGNOSIS — R051 Acute cough: Secondary | ICD-10-CM

## 2023-11-21 DIAGNOSIS — R059 Cough, unspecified: Secondary | ICD-10-CM | POA: Diagnosis not present

## 2023-11-21 MED ORDER — HYDROCODONE BIT-HOMATROP MBR 5-1.5 MG/5ML PO SOLN: 5.0000 mL | Freq: Three times a day (TID) | ORAL | 0 refills | Status: AC | PRN

## 2023-11-21 NOTE — Progress Notes (Signed)
 Subjective:    Patient ID: Ann Vaughan, female    DOB: 13-Apr-2005, 18 y.o.   MRN: [id number]  HPI  18 year old female who  has a past medical history of Allergies, Anxiety (2020), Asthma, Asthma, Constipation, Depression, Eating disorder, Endometriosis (2019), GERD (gastroesophageal reflux disease), and OCD (obsessive compulsive disorder).  She presents to the office today for follow-up.  I had seen her roughly 6 days ago for an acute issue.  At this time she reported that she had a productive cough x 1.  Phlegm was clear.  She was experiencing some wheezing when she coughing attack and had been using her inhalers that she uses for asthma which were helping with the wheezing but not with the cough.  At this time she did report that she was using her inhalers more during the fall.  She denied any fevers, chills, sinus pressure.  On exam she did have some trace wheezing throughout and she was treated for suspected acute asthma exacerbation with a prednisone  course and Tessalon Perles.  Today she reports that the cough has not improved but it has not become any worse.  She feels as though she is breathing here and not really having any wheezing and has not had to use her inhalers much.  She did use a nebulizer this morning but this did not help.  Her cough continues.  She did have some nausea with the [facility] so she did not continue this.  Of note, she did find out that her mom was diagnosed with MAC and her mother's wife is in the process of diagnosis for suspected sarcoidosis.  There is concern that both of these lung diseases came from the house that they were living in and the patient was also living in a house at this time.  He has not had any fevers, chills or unintentional weight loss  Review of Systems See HPI   Past Medical History:  Diagnosis Date   Allergies    Anxiety 2020   Asthma    Asthma    Constipation    12/20/2021   Depression    Eating disorder     Endometriosis 2019   GERD (gastroesophageal reflux disease)    OCD (obsessive compulsive disorder)     Social History   Socioeconomic History   Marital status: Single    Spouse name: Not on file   Number of children: 0   Years of education: Not on file   Highest education level: Some college, no degree  Occupational History   Occupation: retail associated  Tobacco Use   Smoking status: Never    Passive exposure: Yes   Smokeless tobacco: Never  Vaping Use   Vaping status: Never Used  Substance and Sexual Activity   Alcohol use: Not Currently   Drug use: Never   Sexual activity: Never    Birth control/protection: Abstinence, Pill  Other Topics Concern   Not on file  Social History Narrative   Not on file   Social Drivers of Health   Financial Resource Strain: Patient Declined (09/10/2023)   Overall Financial Resource Strain (CARDIA)    Difficulty of Paying Living Expenses: Patient declined  Food Insecurity: No Food Insecurity (09/25/2023)   Hunger Vital Sign    Worried About Running Out of Food in the Last Year: Never true    Ran Out of Food in the Last Year: Never true  Transportation Needs: No Transportation Needs (09/25/2023)   PRAPARE - Transportation  Lack of Transportation (Medical): No    Lack of Transportation (Non-Medical): No  Physical Activity: Insufficiently Active (09/10/2023)   Exercise Vital Sign    Days of Exercise per Week: 3 days    Minutes of Exercise per Session: 10 min  Stress: Stress Concern Present (09/10/2023)   [employer] of Occupational Health - Occupational Stress Questionnaire    Feeling of Stress: Very much  Social Connections: Socially Isolated (09/10/2023)   Social Connection and Isolation Panel    Frequency of Communication with Friends and Family: More than three times a week    Frequency of Social Gatherings with Friends and Family: Once a week    Attends Religious Services: Never    Database administrator or Organizations: No     Attends Engineer, structural: Not on file    Marital Status: Never married  Intimate Partner Violence: Unknown (05/19/2021)   Received from [facility]   HITS    Physically Hurt: Not on file    Insult or Talk Down To: Not on file    Threaten Physical Harm: Not on file    Scream or Curse: Not on file    Past Surgical History:  Procedure Laterality Date   BREAST REDUCTION SURGERY Bilateral 05/28/2023   Procedure: bilateral breast reduction;  Surgeon: Waddell Leonce NOVAK, MD;  Location: [facility];  Service: Plastics;  Laterality: Bilateral;   CYSTOSCOPY N/A 04/24/2022   Procedure: CYSTOSCOPY;  Surgeon: Jeralyn Crutch, MD;  Location: [facility];  Service: Gynecology;  Laterality: N/A;   LAPAROSCOPY N/A 04/24/2022   Procedure: LAPAROSCOPY DIAGNOSTIC AND POSSIBLE EXCISION OF ENDOMETRIOSIS;  Surgeon: Jeralyn Crutch, MD;  Location: [facility];  Service: Gynecology;  Laterality: N/A;  Pull instruments GYN Cysto Set  Cysto Tubing and addtional Stryker Camera   MYRINGOTOMY      Family History  Problem Relation Age of Onset   Asthma Mother    Depression Mother    Irritable bowel syndrome Mother    Scleroderma Mother    Anxiety disorder Mother    Alcohol abuse Father    Asthma Brother    Diabetes Maternal Grandmother    Arthritis Maternal Grandmother    Depression Maternal Grandmother    Arthritis Maternal Grandfather    Depression Maternal Grandfather    Cancer Paternal Grandmother    Uterine cancer Paternal Grandmother    Arthritis Paternal Grandfather    Esophageal cancer Neg Hx    Liver disease Neg Hx     Allergies  Allergen Reactions   Other Shortness Of Breath    Environental/Asthma     Current Outpatient Medications on File Prior to Visit  Medication Sig Dispense Refill   albuterol  (PROVENTIL ) (2.5 MG/3ML) 0.083% nebulizer solution Inhale 3 mLs (1 vial) by nebulization every 6 (six) hours as needed. For shortness of breath 150 mL 2   albuterol   (VENTOLIN  HFA) 108 (90 Base) MCG/ACT inhaler Inhale 2 puffs into the lungs every 4 (four) hours as needed for wheezing or shortness of breath. 18 each 1   beclomethasone (QVAR  REDIHALER) 40 MCG/ACT inhaler Inhale 2 puffs into the lungs 2 (two) times daily. 1 each 3   benzonatate (TESSALON) 200 MG capsule Take 1 capsule (200 mg total) by mouth 3 (three) times daily as needed for cough. 20 capsule 0   cholecalciferol (VITAMIN D3) 25 MCG (1000 UT) tablet Take 2,000 Units by mouth in the morning.     cyclobenzaprine  (FLEXERIL ) 10 MG tablet Take 1 tablet (10 mg  total) by mouth 2 (two) times daily as needed for muscle spasms. 30 tablet 1   hydrOXYzine  (ATARAX ) 25 MG tablet Take 25-50 mg by mouth at bedtime.     lamoTRIgine (LAMICTAL) 150 MG tablet Take 150 mg by mouth 2 (two) times daily.     lithium  carbonate 150 MG capsule Take 150 mg by mouth at bedtime.     norethindrone -ethinyl estradiol-iron (JUNEL FE 1.5/30) 1.5-30 MG-MCG tablet Take 1 tablet by mouth daily. 90 tablet 4   ondansetron  (ZOFRAN ) 4 MG tablet Take 1 tablet (4 mg total) by mouth every 8 (eight) hours as needed for nausea or vomiting. 20 tablet 0   polyethylene glycol (MIRALAX  / GLYCOLAX ) 17 g packet Take 17 g by mouth daily as needed. 14 each 0   predniSONE  (DELTASONE ) 10 MG tablet 40 mg x 3 days, 20 mg x 3 days, 10 mg x 3 days 21 tablet 0   QELBREE 150 MG 24 hr capsule Take 300 mg by mouth at bedtime.     No current facility-administered medications on file prior to visit.    BP 128/62   Pulse 98   Temp 98.3 F (36.8 C) (Oral)   Ht 5' 4.5 (1.638 m)   Wt 143 lb (64.9 kg)   SpO2 98%   BMI 24.17 kg/m       Objective:   Physical Exam Vitals and nursing note reviewed.  Constitutional:      Appearance: Normal appearance.  Cardiovascular:     Rate and Rhythm: Normal rate and regular rhythm.     Pulses: Normal pulses.     Heart sounds: Normal heart sounds.  Pulmonary:     Effort: Pulmonary effort is normal.     Breath  sounds: Normal breath sounds. No stridor. No wheezing, rhonchi or rales.     Comments: Cough present  Musculoskeletal:        General: Normal range of motion.  Skin:    General: Skin is warm and dry.  Neurological:     General: No focal deficit present.     Mental Status: She is alert and oriented to person, place, and time.  Psychiatric:        Mood and Affect: Mood normal.        Behavior: Behavior normal.        Thought Content: Thought content normal.        Judgment: Judgment normal.       Assessment & Plan:  1. Acute cough (Primary) - Possibly postinfectious cough or upper airway cough syndrome. I do not think this is GERD or laryngeal reflux. Due to duration and concern for family members pulmonary disease will get chest xray. Will send in Hycodan as a cough suppressant  - DG Chest 2 View; Future - HYDROcodone bit-homatropine (HYCODAN) 5-1.5 MG/5ML syrup; Take 5 mLs by mouth every 8 (eight) hours as needed for cough.  Dispense: 120 mL; Refill: 0  Darleene Shape, NP  I personally spent a total of 31 minutes in the care of the patient today including preparing to see the patient, getting/reviewing separately obtained history, performing a medically appropriate exam/evaluation, placing orders, and documenting clinical information in the EHR.

## 2023-11-26 ENCOUNTER — Ambulatory Visit: Payer: Self-pay | Admitting: Adult Health

## 2023-12-02 ENCOUNTER — Ambulatory Visit: Admitting: Family Medicine

## 2023-12-02 ENCOUNTER — Ambulatory Visit: Payer: Self-pay

## 2023-12-02 VITALS — BP 106/70 | HR 113 | Temp 98.3°F | Ht 64.4 in | Wt 140.0 lb

## 2023-12-02 DIAGNOSIS — R1030 Lower abdominal pain, unspecified: Secondary | ICD-10-CM | POA: Diagnosis not present

## 2023-12-02 DIAGNOSIS — R6889 Other general symptoms and signs: Secondary | ICD-10-CM

## 2023-12-02 DIAGNOSIS — R630 Anorexia: Secondary | ICD-10-CM | POA: Diagnosis not present

## 2023-12-02 DIAGNOSIS — R052 Subacute cough: Secondary | ICD-10-CM

## 2023-12-02 LAB — POCT INFLUENZA A/B
Influenza A, POC: NEGATIVE
Influenza B, POC: NEGATIVE

## 2023-12-02 NOTE — Progress Notes (Signed)
 Established Patient Office Visit   Subjective:  Patient ID: Ann Vaughan, female    DOB: October 28, 2005  Age: 18 y.o. MRN: [id number]  Chief Complaint  Patient presents with   Abdominal Pain    Abdominal pain mild  And loss of appetite    Fatigue    Abdominal Pain   Patient is here for an acute visit. Patients primary care provider is Dr. Heron Sharper. Patient is complaining of loss of appetite- small breakfast (cereal) and salad at dinner; fatigue, mild dizziness when moving around, nausea with no vomiting. and mild abd pain.  Denies diarrhea, constipation, or fever. Pain usually last about 30 minutes. Abd pain is in mid abd, dull pain.  Intermittent. This started on Friday with loss of appetite.   She continues to have a productive cough, clear sputum. She has had a chest x-ray that was normal and taking Hycodan cough syrup that has helped the most today.   Review of Systems  Gastrointestinal:  Positive for abdominal pain.   See HPI above     Objective:   BP 106/70   Pulse (!) 113   Temp 98.3 F (36.8 C) (Oral)   Ht 5' 4.4 (1.636 m)   Wt 140 lb (63.5 kg)   SpO2 97%   BMI 23.73 kg/m    Physical Exam Vitals reviewed.  Constitutional:      General: She is not in acute distress.    Appearance: Normal appearance. She is not ill-appearing, toxic-appearing or diaphoretic.  HENT:     Head: Normocephalic and atraumatic.  Eyes:     General:        Right eye: No discharge.        Left eye: No discharge.     Conjunctiva/sclera: Conjunctivae normal.  Cardiovascular:     Rate and Rhythm: Normal rate and regular rhythm.     Heart sounds: Normal heart sounds. No murmur heard.    No friction rub. No gallop.  Pulmonary:     Effort: Pulmonary effort is normal. No respiratory distress.     Breath sounds: Normal breath sounds.  Abdominal:     General: Abdomen is flat. Bowel sounds are normal. There is no distension.     Palpations: Abdomen is soft. There is no mass.      Tenderness: There is no abdominal tenderness.  Musculoskeletal:        General: Normal range of motion.  Skin:    General: Skin is warm and dry.  Neurological:     General: No focal deficit present.     Mental Status: She is alert and oriented to person, place, and time. Mental status is at baseline.     Gait: Gait normal.  Psychiatric:        Mood and Affect: Mood normal.        Behavior: Behavior normal.        Thought Content: Thought content normal.        Judgment: Judgment normal.    Results for orders placed or performed in visit on 12/02/23  POC Influenza A/B  Result Value Ref Range   Influenza A, POC Negative Negative   Influenza B, POC Negative Negative     Assessment & Plan:  Lower abdominal pain -     CBC with Differential/Platelet; Future -     Comprehensive metabolic panel with GFR; Future -     Lipase; Future  Loss of appetite  Flu-like symptoms -     POCT  Influenza A/B  Subacute cough  -Influenza negative.  -Recommend to hydrate with 64-100oz of water a day to stay hydrated.  -Eat small bland meals as tolerated.  -Offered Zofran  for nausea, but declined. Advised to please call the office or send MyChart if she changes her mind.  -Ordered labs for reason of symptoms. Please schedule an appointment for labs.  -Follow up if not improved. Suspect this is a viral infection.   Return for lab appointment .   Ann Schomer, NP

## 2023-12-02 NOTE — Telephone Encounter (Signed)
 Appt today with Philippe at 4pm.

## 2023-12-02 NOTE — Telephone Encounter (Signed)
 FYI Only or Action Required?: FYI only for provider.  Patient was last seen in primary care on 11/21/2023 by Merna [name], NP.  Called Nurse Triage reporting Decreased Appetite.  Symptoms began several days ago.  Symptoms are: gradually worsening.  Triage Disposition: See Physician Within 24 Hours  Patient/caregiver understands and will follow disposition?: Yes     Copied from CRM #[id number]. Topic: Clinical - Red Word Triage >> Dec 02, 2023 11:53 AM Harlene ORN wrote: Red Word that prompted transfer to Nurse Triage: Patient called. Has had a loss of appetite since 10/17. Managed to eat small breakfast. Small amounts of dizziness and on and off nausea.     Reason for Disposition  [1] MILD pain (e.g., does not interfere with normal activities) AND [2] pain comes and goes (cramps) AND [3] present > 48 hours  (Exception: This same abdominal pain is a chronic symptom recurrent or ongoing AND present > 4 weeks.)  Answer Assessment - Initial Assessment Questions 1. LOCATION: Where does it hurt?      Central abdomen  2. RADIATION: Does the pain shoot anywhere else? (e.g., chest, back)     No 3. ONSET: When did the pain begin? (e.g., minutes, hours or days ago)      3-4 days ago  5. PATTERN Does the pain come and go, or is it constant?     Intermittent  6. SEVERITY: How bad is the pain?  (e.g., Scale 1-10; mild, moderate, or severe)     Mild  7. RECURRENT SYMPTOM: Have you ever had this type of stomach pain before? If Yes, ask: When was the last time? and What happened that time?      No 8. CAUSE: What do you think is causing the stomach pain? (e.g., gallstones, recent abdominal surgery)     Unsure  9. RELIEVING/AGGRAVATING FACTORS: What makes it better or worse? (e.g., antacids, bending or twisting motion, bowel movement)     No 10. OTHER SYMPTOMS: Do you have any other symptoms? (e.g., back pain, diarrhea, fever, urination pain, vomiting)       Decreased  appetite, mild dizziness this morning 11. PREGNANCY: Is there any chance you are pregnant? When was your last menstrual period?       No  Protocols used: Abdominal Pain - Female-A-AH

## 2023-12-02 NOTE — Patient Instructions (Addendum)
-  It was nice to care for you today.  -Influenza negative.  -Recommend to hydrate with 64-100oz of water a day to stay hydrated.  -Eat small bland meals as tolerated.  -If you change your mind about Zofran  for nausea, please call the office or send MyChart.  -Ordered labs for reason of symptoms. Please schedule an appointment for labs.  -Follow up if not improved.

## 2023-12-03 ENCOUNTER — Other Ambulatory Visit (INDEPENDENT_AMBULATORY_CARE_PROVIDER_SITE_OTHER)

## 2023-12-03 DIAGNOSIS — R1030 Lower abdominal pain, unspecified: Secondary | ICD-10-CM

## 2023-12-03 NOTE — Telephone Encounter (Signed)
 Noted- ok to close.

## 2023-12-04 LAB — CBC WITH DIFFERENTIAL/PLATELET
Basophils Absolute: 0.1 K/uL (ref 0.0–0.1)
Basophils Relative: 1.3 % (ref 0.0–3.0)
Eosinophils Absolute: 0.5 K/uL (ref 0.0–0.7)
Eosinophils Relative: 5.4 % — ABNORMAL HIGH (ref 0.0–5.0)
HCT: 37.6 % (ref 36.0–46.0)
Hemoglobin: 12 g/dL (ref 12.0–15.0)
Lymphocytes Relative: 23.2 % (ref 12.0–46.0)
Lymphs Abs: 2.3 K/uL (ref 0.7–4.0)
MCHC: 31.9 g/dL (ref 30.0–36.0)
MCV: 79.5 fl (ref 78.0–100.0)
Monocytes Absolute: 1 K/uL (ref 0.1–1.0)
Monocytes Relative: 10.4 % (ref 3.0–12.0)
Neutro Abs: 6 K/uL (ref 1.4–7.7)
Neutrophils Relative %: 59.7 % (ref 43.0–77.0)
Platelets: 473 K/uL — ABNORMAL HIGH (ref 150.0–400.0)
RBC: 4.72 Mil/uL (ref 3.87–5.11)
RDW: 15.5 % — ABNORMAL HIGH (ref 11.5–14.6)
WBC: 10.1 K/uL (ref 4.5–10.5)

## 2023-12-04 LAB — COMPREHENSIVE METABOLIC PANEL WITH GFR
ALT: 16 U/L (ref 0–35)
AST: 33 U/L (ref 0–37)
Albumin: 4.2 g/dL (ref 3.5–5.2)
Alkaline Phosphatase: 92 U/L (ref 39–117)
BUN: 8 mg/dL (ref 6–23)
CO2: 24 meq/L (ref 19–32)
Calcium: 9.4 mg/dL (ref 8.4–10.5)
Chloride: 104 meq/L (ref 96–112)
Creatinine, Ser: 1.01 mg/dL (ref 0.40–1.20)
GFR: 80.22 mL/min (ref >60.00)
Glucose, Bld: 99 mg/dL (ref 70–99)
Potassium: 4.3 meq/L (ref 3.5–5.1)
Sodium: 135 meq/L (ref 135–145)
Total Bilirubin: 0.3 mg/dL (ref 0.2–1.2)
Total Protein: 7.4 g/dL (ref 6.0–8.3)

## 2023-12-04 LAB — LIPASE: Lipase: 25 U/L (ref 11.0–59.0)

## 2023-12-05 ENCOUNTER — Ambulatory Visit: Payer: Self-pay | Admitting: Family Medicine

## 2023-12-09 ENCOUNTER — Telehealth: Payer: Self-pay | Admitting: *Deleted

## 2023-12-09 NOTE — Telephone Encounter (Signed)
 Copied from CRM [id number]. Topic: General - Call Back - No Documentation >> Dec 05, 2023  2:15 PM Alfonso ORN wrote: Reason for CRM: pt called to return call, she stated she is doing well and appetite is getting better.

## 2023-12-24 DIAGNOSIS — F900 Attention-deficit hyperactivity disorder, predominantly inattentive type: Secondary | ICD-10-CM | POA: Diagnosis not present

## 2023-12-24 DIAGNOSIS — F331 Major depressive disorder, recurrent, moderate: Secondary | ICD-10-CM | POA: Diagnosis not present

## 2023-12-24 DIAGNOSIS — F419 Anxiety disorder, unspecified: Secondary | ICD-10-CM | POA: Diagnosis not present

## 2023-12-24 DIAGNOSIS — F509 Eating disorder, unspecified: Secondary | ICD-10-CM | POA: Diagnosis not present

## 2024-01-31 ENCOUNTER — Ambulatory Visit: Admitting: Family Medicine
